# Patient Record
Sex: Female | Born: 1960 | Hispanic: No | Marital: Single | Smoking: Never smoker
Health system: Southern US, Community
[De-identification: ages and names within clinical notes are randomized; demographics above are authoritative.]

## PROBLEM LIST (undated history)

## (undated) DIAGNOSIS — R51 Headache: Secondary | ICD-10-CM

## (undated) DIAGNOSIS — K219 Gastro-esophageal reflux disease without esophagitis: Secondary | ICD-10-CM

## (undated) DIAGNOSIS — R87810 Cervical high risk human papillomavirus (HPV) DNA test positive: Secondary | ICD-10-CM

## (undated) DIAGNOSIS — R87619 Unspecified abnormal cytological findings in specimens from cervix uteri: Secondary | ICD-10-CM

## (undated) DIAGNOSIS — I10 Essential (primary) hypertension: Secondary | ICD-10-CM

## (undated) DIAGNOSIS — R519 Headache, unspecified: Secondary | ICD-10-CM

## (undated) HISTORY — DX: Unspecified abnormal cytological findings in specimens from cervix uteri: R87.619

## (undated) HISTORY — DX: Cervical high risk human papillomavirus (HPV) DNA test positive: R87.810

## (undated) HISTORY — PX: IRRIGATION AND DEBRIDEMENT SEBACEOUS CYST: SHX5255

## (undated) HISTORY — DX: Gastro-esophageal reflux disease without esophagitis: K21.9

## (undated) HISTORY — DX: Headache: R51

## (undated) HISTORY — DX: Essential (primary) hypertension: I10

## (undated) HISTORY — DX: Headache, unspecified: R51.9

---

## 2010-11-22 DIAGNOSIS — I10 Essential (primary) hypertension: Secondary | ICD-10-CM | POA: Insufficient documentation

## 2011-03-23 DIAGNOSIS — Z0000 Encounter for general adult medical examination without abnormal findings: Secondary | ICD-10-CM | POA: Insufficient documentation

## 2011-06-16 DIAGNOSIS — L723 Sebaceous cyst: Secondary | ICD-10-CM | POA: Insufficient documentation

## 2011-12-13 DIAGNOSIS — F32A Depression, unspecified: Secondary | ICD-10-CM | POA: Insufficient documentation

## 2011-12-13 DIAGNOSIS — B9681 Helicobacter pylori [H. pylori] as the cause of diseases classified elsewhere: Secondary | ICD-10-CM | POA: Insufficient documentation

## 2012-06-19 DIAGNOSIS — G44209 Tension-type headache, unspecified, not intractable: Secondary | ICD-10-CM | POA: Insufficient documentation

## 2012-06-19 DIAGNOSIS — G43909 Migraine, unspecified, not intractable, without status migrainosus: Secondary | ICD-10-CM | POA: Insufficient documentation

## 2012-12-04 DIAGNOSIS — M722 Plantar fascial fibromatosis: Secondary | ICD-10-CM | POA: Insufficient documentation

## 2013-03-05 DIAGNOSIS — M62838 Other muscle spasm: Secondary | ICD-10-CM | POA: Insufficient documentation

## 2013-08-19 DIAGNOSIS — J00 Acute nasopharyngitis [common cold]: Secondary | ICD-10-CM | POA: Insufficient documentation

## 2016-04-13 ENCOUNTER — Ambulatory Visit (INDEPENDENT_AMBULATORY_CARE_PROVIDER_SITE_OTHER): Payer: Managed Care, Other (non HMO) | Admitting: Physician Assistant

## 2016-04-13 ENCOUNTER — Encounter: Payer: Self-pay | Admitting: Physician Assistant

## 2016-04-13 VITALS — BP 112/76 | HR 71 | Ht 65.0 in | Wt 158.0 lb

## 2016-04-13 DIAGNOSIS — A084 Viral intestinal infection, unspecified: Secondary | ICD-10-CM | POA: Diagnosis not present

## 2016-04-13 DIAGNOSIS — H8110 Benign paroxysmal vertigo, unspecified ear: Secondary | ICD-10-CM | POA: Insufficient documentation

## 2016-04-13 MED ORDER — LOPERAMIDE HCL 2 MG PO TABS: ORAL_TABLET | ORAL | 0 refills | Status: DC

## 2016-04-13 NOTE — Progress Notes (Signed)
HPI:                                                                Frances Taylor is a 56 y.o. female who presents to [facility] [place] today to establish care   Current Concerns include diarrhea  Patient reports sudden onset nausea, vomiting and diarrhea over the weekend while at the beach. She states this was preceded by eating some seafood. Since that time nausea and vomiting have resolved, but she continues to have loose stools. She is tolerating PO and is actually eating her normal diet. Denies fever, chills. She states she became concerned today because she noticed blood on the tissue paper when she wiped after having a bowel movement. Denies hematochezia, melena, or tenesmus. She does endorse some "burning" abdominal pains. She has been taking Mylanta for this with some relief.  Patient states she was receiving primary care at [facility] and will have records transferred here. She states she had blood work last week. Pap: 2017 Mammogram: 08/2015 Colonoscopy: 08/2014  Health Maintenance Health Maintenance  Topic Date Due  . Hepatitis C Screening  12/19/1960  . HIV Screening  08/09/1976  . TETANUS/TDAP  08/09/1980  . INFLUENZA VACCINE  08/25/2015  . MAMMOGRAM  08/24/2017  . PAP SMEAR  08/10/2018  . COLONOSCOPY  08/24/2024    GYN/Sexual Health  Menstrual status: postmenopausal  LMP: age 21  Menses: n/a  Last pap smear: 2017 - negative  History of abnormal pap smears: no  Sexually active: not currently  Current contraception: none  Past Medical History:  Diagnosis Date  . Headache   . Hypertension    took herself off medication, lifestyle   Past Surgical History:  Procedure Laterality Date  . IRRIGATION AND DEBRIDEMENT SEBACEOUS CYST     scalp   Social History  Substance Use Topics  . Smoking status: Never Smoker  . Smokeless tobacco: Never Used  . Alcohol use No   family history is not on  file.  ROS: negative except as noted in the HPI  Medications: Current Outpatient Prescriptions  Medication Sig Dispense Refill  . Multiple Vitamins-Minerals (MULTIVITAMIN ADULTS 50+) TABS Take by mouth.    . loperamide (IMODIUM A-D) 2 MG tablet 4 mg ORALLY followed by 2 mg after each loose stool up to a maximum of 16 mg/day 30 tablet 0   No current facility-administered medications for this visit.    No Known Allergies     Objective:  BP 112/76   Pulse 71   Ht 5\' 5"  (1.651 m)   Wt 158 lb (71.7 kg)   BMI 26.29 kg/m  Gen: well-groomed, cooperative, not ill-appearing, no distress HEENT: normal conjunctiva, wearing glasses, oropharynx clear, moist mucus membranes,  Pulm: Normal work of breathing, normal phonation, clear to auscultation bilaterally CV: Normal rate, regular rhythm, s1 and s2 distinct, no murmurs, clicks or rubs, no carotid bruit GI: bowel sounds active, abdomen soft, nondistended, LLQ tenderness, no rebound, no guarding, no masses, normal rectum, Guaiac negative stool Neuro: alert and oriented x 3, EOM's intact, no tremor MSK: moving all extremities, normal gait and station, no peripheral edema Skin: warm and dry, no rashes or lesions on exposed skin Psych: normal affect, euthymic mood, normal speech  and thought content  Depression screen [facility] 2/9 04/14/2016  Decreased Interest 0  Down, Depressed, Hopeless 0  PHQ - 2 Score 0    Assessment and Plan: 56 y.o. female with  Viral gastroenteritis - recommend conservative treatment for the next 72 hours. Mild LLQ tenderness on exam today, but no peritoneal signs. Tolerating PO. Guaiac negative stool - OTC Culturelle probiotics - Bowel rest for the next 24 hours: only clear liquids. Then introduce a bland diet (see below) - Imodium as needed for diarrhea: 4 mg ORALLY followed by 2 mg after each loose stool up to a maximum of 16 mg/day  - If no improvement with conservative measures in 72 hours, will obtain CT  Abd/Pelvis  Patient education and anticipatory guidance given Patient agrees with treatment plan Follow-up in 3 days or sooner as needed  Darlyne Russian PA-C

## 2016-04-13 NOTE — Patient Instructions (Addendum)
- Bowel rest for the next 24 hours: only clear liquids. Then introduce a bland diet (see below) - Imodium as needed for diarrhea: 4 mg ORALLY followed by 2 mg after each loose stool up to a maximum of 16 mg/day  - Follow-up if you are still having diarrhea in 3 days  Clear Liquid Diet, Adult A clear liquid diet is a diet that includes only liquids that you can see through. You may need to follow a clear liquid diet if:  You develop a medical condition right before or after you have surgery.  You were not able to eat food for a long period of time.  You had a condition that gave you diarrhea.  You are going to have an exam, such as a colonoscopy, in which instruments will be put into your body to look at parts of your digestive system.  You are going to have bowel surgery. The usual goals of this diet are:  To rest the stomach and digestive system as much as possible.  To keep you hydrated.  To make sure you get some calories for energy.  To help you return to normal digestion. Most people need to follow this diet for only a short period of time. What do I need to know about this diet?  A clear liquid is a liquid that you can see through when you hold it up to a light.  A clear liquid diet does not provide all the nutrients that you need. It is important to choose a variety of the liquids that are allowed on this diet. That way, you will get as many nutrients as possible.  If you are not sure whether you can have certain items, ask your health care provider. What can I have?  Water and flavored water.  Fruit juices that do not have pulp, such as cranberry juice and apple juice.  Tea and coffee without milk or cream.  Clear bouillon or broth.  Broth-based soups that have been strained.  Flavored gelatins.  Honey.  Sugar water.  Frozen ice or frozen ice pops that do not contain milk, yogurt, fruit pieces, or fruit pulp.  Clear sodas.  Clear sports drinks. The  items listed above may not be a complete list of recommended liquids. Contact your dietitian for more options.  What can I not have?  Juices that have pulp.  Milk.  Cream or cream-based soups.  Yogurt. The items listed above may not be a complete list of liquids to avoid. Contact your dietitian for more information.  Summary  A clear liquid diet is a diet that includes only liquids that you can see through.  The goal of this diet is to help you recover by resting your digestive system, keeping you hydrated, and providing nutrients.  Make sure to avoid liquids with milk, cream, or pulp while on this diet. This information is not intended to replace advice given to you by your health care provider. Make sure you discuss any questions you have with your health care provider. Document Released: 01/10/2005 Document Revised: 08/25/2015 Document Reviewed: 12/07/2012 Elsevier Interactive Patient Education  2017 [place] Diet A bland diet consists of foods that do not have a lot of fat or fiber. Foods without fat or fiber are easier for the body to digest. They are also less likely to irritate your mouth, throat, stomach, and other parts of your gastrointestinal tract. A bland diet is sometimes called a BRAT diet. What is  my plan? Your health care provider or dietitian may recommend specific changes to your diet to prevent and treat your symptoms, such as:  Eating small meals often.  Cooking food until it is soft enough to chew easily.  Chewing your food well.  Drinking fluids slowly.  Not eating foods that are very spicy, sour, or fatty.  Not eating citrus fruits, such as oranges and grapefruit. What do I need to know about this diet?  Eat a variety of foods from the bland diet food list.  Do not follow a bland diet longer than you have to.  Ask your health care provider whether you should take vitamins. What foods can I eat? Grains   Hot cereals, such as cream  of wheat. Bread, crackers, or tortillas made from refined white flour. Rice. Vegetables  Canned or cooked vegetables. Mashed or boiled potatoes. Fruits  Bananas. Applesauce. Other types of cooked or canned fruit with the skin and seeds removed, such as canned peaches or pears. Meats and Other Protein Sources  Scrambled eggs. Creamy peanut butter or other nut butters. Lean, well-cooked meats, such as chicken or fish. Tofu. Soups or broths. Dairy  Low-fat dairy products, such as milk, cottage cheese, or yogurt. Beverages  Water. Herbal tea. Apple juice. Sweets and Desserts  Pudding. Custard. Fruit gelatin. Ice cream. Fats and Oils  Mild salad dressings. Canola or olive oil. The items listed above may not be a complete list of allowed foods or beverages. Contact your dietitian for more options.  What foods are not recommended? Foods and ingredients that are often not recommended include:  Spicy foods, such as hot sauce or salsa.  Fried foods.  Sour foods, such as pickled or fermented foods.  Raw vegetables or fruits, especially citrus or berries.  Caffeinated drinks.  Alcohol.  Strongly flavored seasonings or condiments. The items listed above may not be a complete list of foods and beverages that are not allowed. Contact your dietitian for more information.  This information is not intended to replace advice given to you by your health care provider. Make sure you discuss any questions you have with your health care provider. Document Released: 05/04/2015 Document Revised: 06/18/2015 Document Reviewed: 01/22/2014 Elsevier Interactive Patient Education  2017 [employer].

## 2016-04-14 ENCOUNTER — Encounter: Payer: Self-pay | Admitting: Physician Assistant

## 2016-04-25 ENCOUNTER — Ambulatory Visit (INDEPENDENT_AMBULATORY_CARE_PROVIDER_SITE_OTHER): Payer: Managed Care, Other (non HMO) | Admitting: Physician Assistant

## 2016-04-25 VITALS — BP 126/85 | HR 76 | Wt 155.0 lb

## 2016-04-25 DIAGNOSIS — E559 Vitamin D deficiency, unspecified: Secondary | ICD-10-CM | POA: Diagnosis not present

## 2016-04-25 DIAGNOSIS — N952 Postmenopausal atrophic vaginitis: Secondary | ICD-10-CM

## 2016-04-25 DIAGNOSIS — K219 Gastro-esophageal reflux disease without esophagitis: Secondary | ICD-10-CM | POA: Insufficient documentation

## 2016-04-25 MED ORDER — VITAMIN D (ERGOCALCIFEROL) 1.25 MG (50000 UNIT) PO CAPS: 50000.0000 [IU] | ORAL_CAPSULE | ORAL | 0 refills | Status: DC

## 2016-04-25 NOTE — Patient Instructions (Addendum)
[facility] [place] - multivitamin Start Vitamin D - once a week for 8 weeks, then OTC vitamin D3 1000-2000 units daily  Ranitidine (Zantac) or Gaviscon as needed for acid reflux  Food Choices for Gastroesophageal Reflux Disease, Adult When you have gastroesophageal reflux disease (GERD), the foods you eat and your eating habits are very important. Choosing the right foods can help ease the discomfort of GERD. Consider working with a diet and nutrition specialist (dietitian) to help you make healthy food choices. What general guidelines should I follow? Eating plan   Choose healthy foods low in fat, such as fruits, vegetables, whole grains, low-fat dairy products, and lean meat, fish, and poultry.  Eat frequent, small meals instead of three large meals each day. Eat your meals slowly, in a relaxed setting. Avoid bending over or lying down until 2-3 hours after eating.  Limit high-fat foods such as fatty meats or fried foods.  Limit your intake of oils, butter, and shortening to less than 8 teaspoons each day.  Avoid the following:  Foods that cause symptoms. These may be different for different people. Keep a food diary to keep track of foods that cause symptoms.  Alcohol.  Drinking large amounts of liquid with meals.  Eating meals during the 2-3 hours before bed.  Cook foods using methods other than frying. This may include baking, grilling, or broiling. Lifestyle    Maintain a healthy weight. Ask your health care provider what weight is healthy for you. If you need to lose weight, work with your health care provider to do so safely.  Exercise for at least 30 minutes on 5 or more days each week, or as told by your health care provider.  Avoid wearing clothes that fit tightly around your waist and chest.  Do not use any products that contain nicotine or tobacco, such as cigarettes and e-cigarettes. If you need help quitting, ask your health care provider.  Sleep with the head of  your bed raised. Use a wedge under the mattress or blocks under the bed frame to raise the head of the bed. What foods are not recommended? The items listed may not be a complete list. Talk with your dietitian about what dietary choices are best for you. Grains  Pastries or quick breads with added fat. [place] toast. Vegetables  Deep fried vegetables. [place] fries. Any vegetables prepared with added fat. Any vegetables that cause symptoms. For some people this may include tomatoes and tomato products, chili peppers, onions and garlic, and horseradish. Fruits  Any fruits prepared with added fat. Any fruits that cause symptoms. For some people this may include citrus fruits, such as oranges, grapefruit, pineapple, and lemons. Meats and other protein foods  High-fat meats, such as fatty beef or pork, hot dogs, ribs, ham, sausage, salami and bacon. Fried meat or protein, including fried fish and fried chicken. Nuts and nut butters. Dairy  Whole milk and chocolate milk. Sour cream. Cream. Ice cream. Cream cheese. Milk shakes. Beverages  Coffee and tea, with or without caffeine. Carbonated beverages. Sodas. Energy drinks. Fruit juice made with acidic fruits (such as orange or grapefruit). Tomato juice. Alcoholic drinks. Fats and oils  Butter. Margarine. Shortening. Ghee. Sweets and desserts  Chocolate and cocoa. Donuts. Seasoning and other foods  Pepper. Peppermint and spearmint. Any condiments, herbs, or seasonings that cause symptoms. For some people, this may include curry, hot sauce, or vinegar-based salad dressings. Summary  When you have gastroesophageal reflux disease (GERD), food and lifestyle choices are  very important to help ease the discomfort of GERD.  Eat frequent, small meals instead of three large meals each day. Eat your meals slowly, in a relaxed setting. Avoid bending over or lying down until 2-3 hours after eating.  Limit high-fat foods such as fatty meat or fried foods. This  information is not intended to replace advice given to you by your health care provider. Make sure you discuss any questions you have with your health care provider. Document Released: 01/10/2005 Document Revised: 01/12/2016 Document Reviewed: 01/12/2016 Elsevier Interactive Patient Education  2017 Elsevier Inc.  Conjugated Estrogens vaginal cream What is this medicine? CONJUGATED ESTROGENS (CON ju gate ed ESS troe jenz) are a mixture of female hormones. This cream can help relieve symptoms associated with menopause.like vaginal dryness and irritation. This medicine may be used for other purposes; ask your health care provider or pharmacist if you have questions. COMMON BRAND NAME(S): Premarin What should I tell my health care provider before I take this medicine? They need to know if you have any of these conditions: -abnormal vaginal bleeding -blood vessel disease or blood clots -breast, cervical, endometrial, or uterine cancer -dementia -diabetes -gallbladder disease -heart disease or recent heart attack -high blood pressure -high cholesterol -high level of calcium in the blood -hysterectomy -kidney disease -liver disease -migraine headaches -protein C deficiency -protein S deficiency -stroke -systemic lupus erythematosus (SLE) -tobacco smoker -an unusual or allergic reaction to estrogens other medicines, foods, dyes, or preservatives -pregnant or trying to get pregnant -breast-feeding How should I use this medicine? This medicine is for use in the vagina only. Do not take by mouth. Follow the directions on the prescription label. Use at bedtime unless otherwise directed by your doctor or health care professional. Use the special applicator supplied with the cream. Wash hands before and after use. Fill the applicator with the cream and remove from the tube. Lie on your back, part and bend your knees. Insert the applicator into the vagina and push the plunger to expel the cream  into the vagina. Wash the applicator with warm soapy water and rinse well. Use exactly as directed for the complete length of time prescribed. Do not stop using except on the advice of your doctor or health care professional. Talk to your pediatrician regarding the use of this medicine in children. Special care may be needed. A patient package insert for the product will be given with each prescription and refill. Read this sheet carefully each time. The sheet may change frequently. Overdosage: If you think you have taken too much of this medicine contact a poison control center or emergency room at once. NOTE: This medicine is only for you. Do not share this medicine with others. What if I miss a dose? If you miss a dose, use it as soon as you can. If it is almost time for your next dose, use only that dose. Do not use double or extra doses. What may interact with this medicine? Do not take this medicine with any of the following medications: -aromatase inhibitors like aminoglutethimide, anastrozole, exemestane, letrozole, testolactone This medicine may also interact with the following medications: -barbiturates used for inducing sleep or treating seizures -carbamazepine -grapefruit juice -medicines for fungal infections like itraconazole and ketoconazole -raloxifene or tamoxifen -rifabutin -rifampin -rifapentine -ritonavir -some antibiotics used to treat infections -St. John's Wort -warfarin This list may not describe all possible interactions. Give your health care provider a list of all the medicines, herbs, non-prescription drugs, or dietary supplements you  use. Also tell them if you smoke, drink alcohol, or use illegal drugs. Some items may interact with your medicine. What should I watch for while using this medicine? Visit your health care professional for regular checks on your progress. You will need a regular breast and pelvic exam. You should also discuss the need for regular  mammograms with your health care professional, and follow his or her guidelines. This medicine can make your body retain fluid, making your fingers, hands, or ankles swell. Your blood pressure can go up. Contact your doctor or health care professional if you feel you are retaining fluid. If you have any reason to think you are pregnant; stop taking this medicine at once and contact your doctor or health care professional. Tobacco smoking increases the risk of getting a blood clot or having a stroke, especially if you are more than 55 years old. You are strongly advised not to smoke. If you wear contact lenses and notice visual changes, or if the lenses begin to feel uncomfortable, consult your eye care specialist. If you are going to have elective surgery, you may need to stop taking this medicine beforehand. Consult your health care professional for advice prior to scheduling the surgery. What side effects may I notice from receiving this medicine? Side effects that you should report to your doctor or health care professional as soon as possible: -allergic reactions like skin rash, itching or hives, swelling of the face, lips, or tongue -breast tissue changes or discharge -changes in vision -chest pain -confusion, trouble speaking or understanding -dark urine -general ill feeling or flu-like symptoms -light-colored stools -nausea, vomiting -pain, swelling, warmth in the leg -right upper belly pain -severe headaches -shortness of breath -sudden numbness or weakness of the face, arm or leg -trouble walking, dizziness, loss of balance or coordination -unusual vaginal bleeding -yellowing of the eyes or skin Side effects that usually do not require medical attention (report to your doctor or health care professional if they continue or are bothersome): -hair loss -increased hunger or thirst -increased urination -symptoms of vaginal infection like itching, irritation or unusual  discharge -unusually weak or tired This list may not describe all possible side effects. Call your doctor for medical advice about side effects. You may report side effects to FDA at 1-800-FDA-1088. Where should I keep my medicine? Keep out of the reach of children. Store at room temperature between 15 and 30 degrees C (59 and 86 degrees F). Throw away any unused medicine after the expiration date. NOTE: This sheet is a summary. It may not cover all possible information. If you have questions about this medicine, talk to your doctor, pharmacist, or health care provider.  2018 Elsevier/Gold Standard (2010-04-14 09:20:36)

## 2016-04-25 NOTE — Progress Notes (Signed)
HPI:                                                                Frances Taylor is a 56 y.o. female who presents to [facility]: [place] today for follow-up diarrhea  Patient reports diarrhea has resolved. She has not had any BRBPR. She denies fever, chills, abdominal pain, melena, hematochezia, or tenesmus. She is continuing to take Culturelle probiotics. She does endorse occasional dyspepsia after meals and thinks this may be triggered by certain foods. She has been taking Mylanta for this as needed. She had a negative colonoscopy in 2016.  Patient also reports history of early onset menopause. She is not currently sexually active, but has a history of dyspareunia and vaginal dryness.   Past Medical History:  Diagnosis Date  . Headache   . Hypertension    took herself off medication, lifestyle   Past Surgical History:  Procedure Laterality Date  . IRRIGATION AND DEBRIDEMENT SEBACEOUS CYST     scalp   Social History  Substance Use Topics  . Smoking status: Never Smoker  . Smokeless tobacco: Never Used  . Alcohol use No   family history is not on file.  ROS: negative except as noted in the HPI  Medications: Current Outpatient Prescriptions  Medication Sig Dispense Refill  . loperamide (IMODIUM A-D) 2 MG tablet 4 mg ORALLY followed by 2 mg after each loose stool up to a maximum of 16 mg/day 30 tablet 0  . Multiple Vitamins-Minerals (MULTIVITAMIN ADULTS 50+) TABS Take by mouth.     No current facility-administered medications for this visit.    No Known Allergies     Objective:  BP 126/85   Pulse 76   Wt 155 lb (70.3 kg)   BMI 25.79 kg/m  Gen: well-groomed, cooperative, not ill-appearing, no distress Pulm: Normal work of breathing, normal phonation Neuro: alert and oriented x 3, EOM's intact MSK: moving all extremities, no peripheral edema, normal gait and station Psych: good eye contact, euthymic mood, normal speech  and thought content  Assessment and Plan: 56 y.o. female with   1. Gastroesophageal reflux disease, esophagitis presence not specified - OTC Ranitidine or Gaviscon as needed  2. Vitamin D deficiency - Vitamin D, Ergocalciferol, (DRISDOL) 50000 units CAPS capsule; Take 1 capsule (50,000 Units total) by mouth every 7 (seven) days. Take for 8 total doses(weeks)  Dispense: 8 capsule; Refill: 0  3. Postmenopausal atrophic vaginitis - discussed treatment options including vaginal estrogen and Intrarosa. 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 In following guidelines, patient should have repeat co-testing this year  Patient education and anticipatory guidance given Patient agrees with treatment plan Follow-up in 4 weeks for Pap smear or sooner as needed   Darlyne Russian PA-C

## 2016-04-27 ENCOUNTER — Encounter: Payer: Self-pay | Admitting: Physician Assistant

## 2016-04-27 DIAGNOSIS — N952 Postmenopausal atrophic vaginitis: Secondary | ICD-10-CM | POA: Insufficient documentation

## 2016-04-28 ENCOUNTER — Telehealth: Payer: Self-pay

## 2016-04-28 NOTE — Telephone Encounter (Signed)
Left vm for pt to return call to clinic in regards to recommendations listed below. -EH/RMA

## 2016-04-28 NOTE — Telephone Encounter (Signed)
-----   Message from [facility], [place] sent at 04/27/2016  9:47 PM EDT ----- Can you let patient know I was reviewing her records. 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

## 2016-05-12 ENCOUNTER — Encounter: Payer: Self-pay | Admitting: Physician Assistant

## 2016-05-12 ENCOUNTER — Ambulatory Visit (INDEPENDENT_AMBULATORY_CARE_PROVIDER_SITE_OTHER): Payer: Managed Care, Other (non HMO) | Admitting: Physician Assistant

## 2016-05-12 VITALS — BP 132/84 | HR 65 | Temp 98.2°F | Wt 156.0 lb

## 2016-05-12 DIAGNOSIS — T733XXA Exhaustion due to excessive exertion, initial encounter: Secondary | ICD-10-CM

## 2016-05-12 NOTE — Progress Notes (Signed)
HPI:                                                                Frances Taylor is a 56 y.o. female who presents to [place]: [place] today for fatigue  Patient reports generalized fatigue for approximately 1 week. She states she has never felt like this in her entire life. She denies any recent illness. She denies any associated symptoms including fever, chills, night sweats, myalgias, arthralgias, nausea, abdominal pain, change in bowel or bladder habits, easy bruising/bleeding, lymphadenopathy, or rash. She does report she is working 2 jobs, totaling 70 hours per week. She is going to sleep around 1 am and waking up around 5 am and does not feel rested. She began doing this approx 2 weeks ago.  Past Medical History:  Diagnosis Date  . 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 GERD (gastroesophageal reflux disease)   . Headache   . Hypertension    took herself off medication, lifestyle   Past Surgical History:  Procedure Laterality Date  . IRRIGATION AND DEBRIDEMENT SEBACEOUS CYST     scalp   Social History  Substance Use Topics  . Smoking status: Never Smoker  . Smokeless tobacco: Never Used  . Alcohol use No   family history is not on file.  ROS: negative except as noted in the HPI  Medications: Current Outpatient Prescriptions  Medication Sig Dispense Refill  . Lactobacillus-Inulin ([place] PO) Take by mouth.    . Multiple Vitamins-Minerals (MULTIVITAMIN ADULTS 50+) TABS Take by mouth.    . Vitamin D, Ergocalciferol, (DRISDOL) 50000 units CAPS capsule Take 1 capsule (50,000 Units total) by mouth every 7 (seven) days. Take for 8 total doses(weeks) 8 capsule 0   No current facility-administered medications for this visit.    No Known Allergies     Objective:  BP 132/84   Pulse 65   Temp 98.2 F (36.8 C)   Wt 156 lb (70.8 kg)   BMI 25.96 kg/m  Gen: well-groomed,  cooperative, appears tired, not ill-appearing, no distress HEENT: normal conjunctiva, TM's clear, oropharynx clear, moist mucus membranes,  Pulm: Normal work of breathing, normal phonation, clear to auscultation bilaterally, no wheezes, rales or rhonchi CV: Normal rate, regular rhythm, s1 and s2 distinct, no murmurs, clicks or rubs  GI: soft, nondistended, nontender Neuro: alert and oriented x 3, EOM's intact, normal tone, no tremor MSK: moving all extremities, normal gait and station, no peripheral edema Lymph: no cervical or tonsillar adenopathy Skin: warm and dry, no rashes or lesions on exposed skin, no cyanosis   No results found for this or any previous visit (from the past 72 hour(s)). No results found.    Assessment and Plan: 56 y.o. female with   1. Fatigue due to excessive exertion - checking labs, but given no associated symptoms and normal exam,  I think fatigue can be explained by exhaustion from the hours patient is working and lack of sleep - provided with work note to rest - encouraged her to limit work to no more than 50 hours per week - encouraged 6-7 hours of sleep per night - CBC - Comprehensive metabolic panel - C-reactive protein - Ferritin - Sedimentation  rate - TSH - Vitamin B12 - Vit D  25 hydroxy (rtn osteoporosis monitoring)  Patient education and anticipatory guidance given Patient agrees with treatment plan Follow-up in 2 weeks or sooner as needed if symptoms worsen or fail to improve  Darlyne Russian PA-C

## 2016-05-12 NOTE — Patient Instructions (Signed)
- Go downstairs for labs - If possible, limit work to 50 hours per week - Try to get at least 6 - 7 hours of sleep per night - If you are having difficulty falling asleep, try Melatonin 1mg  1 hour prior to bedtime (do not take more than 3mg ) - Practice good sleep hygiene  Sleep Hygiene . Limiting daytime naps to 30 minutes . Napping does not make up for inadequate nighttime sleep. However, a short nap of 20-30 minutes can help to improve mood, alertness and performance.  . Avoiding stimulants such as  caffeine and nicotine close to bedtime.  And when it comes to alcohol, moderation is key 4. While alcohol is well-known to help you fall asleep faster, too much close to bedtime can disrupt sleep in the second half of the night as the body begins to process the alcohol.    . Exercising to promote good quality sleep.  As little as 10 minutes of aerobic exercise, such as walking or cycling, can drastically improve nighttime sleep quality.  For the best night's sleep, most people should avoid strenuous workouts close to bedtime. However, the effect of intense nighttime exercise on sleep differs from person to person, so find out what works best for you.   . Steering clear of food that can be disruptive right before sleep.   Heavy or rich foods, fatty or fried meals, spicy dishes, citrus fruits, and carbonated drinks can trigger indigestion for some people. When this occurs close to bedtime, it can lead to painful heartburn that disrupts sleep. . Ensuring adequate exposure to natural light.  This is particularly important for individuals who may not venture outside frequently. Exposure to sunlight during the day, as well as darkness at night, helps to maintain a healthy sleep-wake cycle . Marland Kitchen Establishing a regular relaxing bedtime routine.  A regular nightly routine helps the body recognize that it is bedtime. This could include taking warm shower or bath, reading a book, or light stretches. When possible, try  to avoid emotionally upsetting conversations and activities before attempting to sleep. . Making sure that the sleep environment is pleasant.  Mattress and pillows should be comfortable. The bedroom should be cool - between 60 and 67 degrees - for optimal sleep. Bright light from lamps, cell phone and TV screens can make it difficult to fall asleep4, so turn those light off or adjust them when possible. Consider using blackout curtains, eye shades, ear plugs, "white noise" machines, humidifiers, fans and other devices that can make the bedroom more relaxing.  Fatigue Fatigue is feeling tired all of the time, a lack of energy, or a lack of motivation. Occasional or mild fatigue is often a normal response to activity or life in general. However, long-lasting (chronic) or extreme fatigue may indicate an underlying medical condition. Follow these instructions at home: Watch your fatigue for any changes. The following actions may help to lessen any discomfort you are feeling:  Talk to your health care provider about how much sleep you need each night. Try to get the required amount every night.  Take medicines only as directed by your health care provider.  Eat a healthy and nutritious diet. Ask your health care provider if you need help changing your diet.  Drink enough fluid to keep your urine clear or pale yellow.  Practice ways of relaxing, such as yoga, meditation, massage therapy, or acupuncture.  Exercise regularly.  Change situations that cause you stress. Try to keep your work and personal  routine reasonable.  Do not abuse illegal drugs.  Limit alcohol intake to no more than 1 drink per day for nonpregnant women and 2 drinks per day for men. One drink equals 12 ounces of beer, 5 ounces of wine, or 1 ounces of hard liquor.  Take a multivitamin, if directed by your health care provider. Contact a health care provider if:  Your fatigue does not get better.  You have a fever.  You  have unintentional weight loss or gain.  You have headaches.  You have difficulty:  Falling asleep.  Sleeping throughout the night.  You feel angry, guilty, anxious, or sad.  You are unable to have a bowel movement (constipation).  You skin is dry.  Your legs or another part of your body is swollen. Get help right away if:  You feel confused.  Your vision is blurry.  You feel faint or pass out.  You have a severe headache.  You have severe abdominal, pelvic, or back pain.  You have chest pain, shortness of breath, or an irregular or fast heartbeat.  You are unable to urinate or you urinate less than normal.  You develop abnormal bleeding, such as bleeding from the rectum, vagina, nose, lungs, or nipples.  You vomit blood.  You have thoughts about harming yourself or committing suicide.  You are worried that you might harm someone else. This information is not intended to replace advice given to you by your health care provider. Make sure you discuss any questions you have with your health care provider. Document Released: 11/07/2006 Document Revised: 06/18/2015 Document Reviewed: 05/14/2013 Elsevier Interactive Patient Education  2017 [employer].

## 2016-05-13 LAB — COMPREHENSIVE METABOLIC PANEL
ALK PHOS: 81 U/L (ref 33–130)
ALT: 23 U/L (ref 6–29)
AST: 30 U/L (ref 10–35)
Albumin: 4 g/dL (ref 3.6–5.1)
BILIRUBIN TOTAL: 0.4 mg/dL (ref 0.2–1.2)
BUN: 9 mg/dL (ref 7–25)
CO2: 26 mmol/L (ref 20–31)
Calcium: 9.6 mg/dL (ref 8.6–10.4)
Chloride: 108 mmol/L (ref 98–110)
Creat: 0.92 mg/dL (ref 0.50–1.05)
GLUCOSE: 84 mg/dL (ref 65–99)
Potassium: 3.9 mmol/L (ref 3.5–5.3)
SODIUM: 143 mmol/L (ref 135–146)
Total Protein: 6.6 g/dL (ref 6.1–8.1)

## 2016-05-13 LAB — FERRITIN: Ferritin: 63 ng/mL (ref 10–232)

## 2016-05-13 LAB — TSH: TSH: 1.98 mIU/L

## 2016-05-13 LAB — CBC
HCT: 39 % (ref 35.0–45.0)
Hemoglobin: 12.5 g/dL (ref 11.7–15.5)
MCH: 25.5 pg — ABNORMAL LOW (ref 27.0–33.0)
MCHC: 32.1 g/dL (ref 32.0–36.0)
MCV: 79.6 fL — ABNORMAL LOW (ref 80.0–100.0)
MPV: 8.9 fL (ref 7.5–12.5)
PLATELETS: 254 10*3/uL (ref 140–400)
RBC: 4.9 MIL/uL (ref 3.80–5.10)
RDW: 13.4 % (ref 11.0–15.0)
WBC: 5.5 10*3/uL (ref 3.8–10.8)

## 2016-05-13 LAB — VITAMIN B12: VITAMIN B 12: 352 pg/mL (ref 200–1100)

## 2016-05-13 LAB — VITAMIN D 25 HYDROXY (VIT D DEFICIENCY, FRACTURES): Vit D, 25-Hydroxy: 54 ng/mL (ref 30–100)

## 2016-05-13 LAB — SEDIMENTATION RATE: Sed Rate: 12 mm/hr (ref 0–30)

## 2016-05-13 LAB — C-REACTIVE PROTEIN: CRP: 0.9 mg/L (ref <8.0)

## 2016-05-19 ENCOUNTER — Telehealth: Payer: Self-pay

## 2016-05-19 NOTE — Telephone Encounter (Signed)
Pt called wondering if her going through menopause could be the cause of her fatigue. Please advise. -EH/RMA

## 2016-05-23 NOTE — Telephone Encounter (Signed)
If menopausal symptoms have recently started it could be contributing. For example, nightsweats can cause sleep disturbance. If she is having hot flashes and nightsweats and would like to discuss hormone replacement, she should definitely make an appointment

## 2016-05-24 NOTE — Telephone Encounter (Signed)
Left vm for pt to return call to clinic -EH/RMA  

## 2016-05-26 ENCOUNTER — Ambulatory Visit: Payer: Managed Care, Other (non HMO) | Admitting: Physician Assistant

## 2016-06-13 ENCOUNTER — Ambulatory Visit (INDEPENDENT_AMBULATORY_CARE_PROVIDER_SITE_OTHER): Payer: Managed Care, Other (non HMO) | Admitting: Physician Assistant

## 2016-06-13 ENCOUNTER — Encounter: Payer: Self-pay | Admitting: Physician Assistant

## 2016-06-13 VITALS — BP 131/86 | HR 75 | Temp 98.2°F | Wt 159.0 lb

## 2016-06-13 DIAGNOSIS — R232 Flushing: Secondary | ICD-10-CM

## 2016-06-13 DIAGNOSIS — Z13820 Encounter for screening for osteoporosis: Secondary | ICD-10-CM

## 2016-06-13 DIAGNOSIS — J069 Acute upper respiratory infection, unspecified: Secondary | ICD-10-CM | POA: Diagnosis not present

## 2016-06-13 DIAGNOSIS — N3001 Acute cystitis with hematuria: Secondary | ICD-10-CM | POA: Diagnosis not present

## 2016-06-13 DIAGNOSIS — E28319 Asymptomatic premature menopause: Secondary | ICD-10-CM

## 2016-06-13 LAB — POCT URINALYSIS DIPSTICK
Bilirubin, UA: NEGATIVE
GLUCOSE UA: NEGATIVE
Ketones, UA: NEGATIVE
NITRITE UA: NEGATIVE
PROTEIN UA: NEGATIVE
Spec Grav, UA: <=1.005 — AB (ref 1.010–1.025)
UROBILINOGEN UA: 0.2 U/dL
pH, UA: 6 (ref 5.0–8.0)

## 2016-06-13 MED ORDER — CALCIUM CARBONATE-VITAMIN D 600-400 MG-UNIT PO TABS: 1.0000 | ORAL_TABLET | Freq: Two times a day (BID) | ORAL | 11 refills | Status: DC

## 2016-06-13 MED ORDER — BENZONATATE 200 MG PO CAPS: 200.0000 mg | ORAL_CAPSULE | Freq: Three times a day (TID) | ORAL | 0 refills | Status: DC | PRN

## 2016-06-13 MED ORDER — NITROFURANTOIN MONOHYD MACRO 100 MG PO CAPS: 100.0000 mg | ORAL_CAPSULE | Freq: Two times a day (BID) | ORAL | 0 refills | Status: DC

## 2016-06-13 NOTE — Patient Instructions (Addendum)
- You will get a phone call to schedule your bone density scan to screen for osteoporosis - Take calcium and vitamin D supplement twice a day   - Take antibiotic twice a day for 7 days - Continue to drink plenty of fluids   Bone Health Bones protect organs, store calcium, and anchor muscles. Good health habits, such as eating nutritious foods and exercising regularly, are important for maintaining healthy bones. They can also help to prevent a condition that causes bones to lose density and become weak and brittle (osteoporosis). Why is bone mass important? Bone mass refers to the amount of bone tissue that you have. The higher your bone mass, the stronger your bones. An important step toward having healthy bones throughout life is to have strong and dense bones during childhood. A young adult who has a high bone mass is more likely to have a high bone mass later in life. Bone mass at its greatest it is called peak bone mass. A large decline in bone mass occurs in older adults. In women, it occurs about the time of menopause. During this time, it is important to practice good health habits, because if more bone is lost than what is replaced, the bones will become less healthy and more likely to break (fracture). If you find that you have a low bone mass, you may be able to prevent osteoporosis or further bone loss by changing your diet and lifestyle. How can I find out if my bone mass is low? Bone mass can be measured with an X-ray test that is called a bone mineral density (BMD) test. This test is recommended for all women who are age 32 or older. It may also be recommended for men who are age 87 or older, or for people who are more likely to develop osteoporosis due to:  Having bones that break easily.  Having a long-term disease that weakens bones, such as kidney disease or rheumatoid arthritis.  Having menopause earlier than normal.  Taking medicine that weakens bones, such as steroids,  thyroid hormones, or hormone treatment for breast cancer or prostate cancer.  Smoking.  Drinking three or more alcoholic drinks each day. What are the nutritional recommendations for healthy bones? To have healthy bones, you need to get enough of the right minerals and vitamins. Most nutrition experts recommend getting these nutrients from the foods that you eat. Nutritional recommendations vary from person to person. Ask your health care provider what is healthy for you. Here are some general guidelines. Calcium Recommendations  Calcium is the most important (essential) mineral for bone health. Most people can get enough calcium from their diet, but supplements may be recommended for people who are at risk for osteoporosis. Good sources of calcium include:  Dairy products, such as low-fat or nonfat milk, cheese, and yogurt.  Dark green leafy vegetables, such as bok choy and broccoli.  Calcium-fortified foods, such as orange juice, cereal, bread, soy beverages, and tofu products.  Nuts, such as almonds. Follow these recommended amounts for daily calcium intake:  Children, age 35?3: 700 mg.  Children, age 76?8: 1,000 mg.  Children, age 357?13: 1,300 mg.  Teens, age 61?18: 1,300 mg.  Adults, age 58?50: 1,000 mg.  Adults, age 69?70:  Men: 1,000 mg.  Women: 1,200 mg.  Adults, age 37 or older: 1,200 mg.  Pregnant and breastfeeding females:  Teens: 1,300 mg.  Adults: 1,000 mg. Vitamin D Recommendations  Vitamin D is the most essential vitamin for bone health.  It helps the body to absorb calcium. Sunlight stimulates the skin to make vitamin D, so be sure to get enough sunlight. If you live in a cold climate or you do not get outside often, your health care provider may recommend that you take vitamin D supplements. Good sources of vitamin D in your diet include:  Egg yolks.  Saltwater fish.  Milk and cereal fortified with vitamin D. Follow these recommended amounts for daily  vitamin D intake:  Children and teens, age 24?18: 37 international units.  Adults, age 17 or younger: 400-800 international units.  Adults, age 81 or older: 800-1,000 international units. Other Nutrients  Other nutrients for bone health include:  Phosphorus. This mineral is found in meat, poultry, dairy foods, nuts, and legumes. The recommended daily intake for adult men and adult women is 700 mg.  Magnesium. This mineral is found in seeds, nuts, dark green vegetables, and legumes. The recommended daily intake for adult men is 400?420 mg. For adult women, it is 310?320 mg.  Vitamin K. This vitamin is found in green leafy vegetables. The recommended daily intake is 120 mg for adult men and 90 mg for adult women. What type of physical activity is best for building and maintaining healthy bones? Weight-bearing and strength-building activities are important for building and maintaining peak bone mass. Weight-bearing activities cause muscles and bones to work against gravity. Strength-building activities increases muscle strength that supports bones. Weight-bearing and muscle-building activities include:  Walking and hiking.  Jogging and running.  Dancing.  Gym exercises.  Lifting weights.  Tennis and racquetball.  Climbing stairs.  Aerobics. Adults should get at least 30 minutes of moderate physical activity on most days. Children should get at least 60 minutes of moderate physical activity on most days. Ask your health care provide what type of exercise is best for you. Where can I find more information? For more information, check out the following websites:  [place]: [employer]se  [employer] of Health: http://www.niams.AnonymousEar.fr.asp This information is not intended to replace advice given to you by your health care provider. Make sure you discuss any questions you have with your  health care provider. Document Released: 04/02/2003 Document Revised: 07/31/2015 Document Reviewed: 01/15/2014 Elsevier Interactive Patient Education  2017 [employer].

## 2016-06-13 NOTE — Progress Notes (Signed)
HPI:                                                                Frances Taylor is a 56 y.o. female who presents to [place]: [place] today for UTI and URI symptoms  URI   This is a new (Patient reports she was babysitting her granddaughter over the weekend, who had a cold) problem. The current episode started yesterday. The problem has been gradually improving. There has been no fever. Associated symptoms include coughing (non-productive), dysuria and rhinorrhea (clear). Pertinent negatives include no congestion, headaches, neck pain, rash or sinus pain. Treatments tried: Alka Seltzer Plus. The treatment provided mild relief.  Dysuria   This is a new problem. The current episode started today. The problem has been unchanged. The quality of the pain is described as burning. Pertinent negatives include no flank pain, frequency, hematuria, hesitancy or urgency. She has tried nothing for the symptoms. There is no history of recurrent UTIs.   Patient also continues to endorse daily and nightly hot flashes that is causing sleep disturbance and fatigue. Patient has a history of premature menopause at age 82. She has a history of vitamin D deficiency and is on supplementation. Most recent vitamin d level was therapeutic. She has never been on HRT. She has never had a Dexa scan. She has not history of pathologic fractures.   Past Medical History:  Diagnosis Date  . Cervical high risk HPV (human papillomavirus) test positive   . GERD (gastroesophageal reflux disease)   . Headache   . Hypertension    took herself off medication, lifestyle   Past Surgical History:  Procedure Laterality Date  . IRRIGATION AND DEBRIDEMENT SEBACEOUS CYST     scalp   Social History  Substance Use Topics  . Smoking status: Never Smoker  . Smokeless tobacco: Never Used  . Alcohol use No   family history is not on file.  ROS: negative except as noted in the  HPI  Medications: Current Outpatient Prescriptions  Medication Sig Dispense Refill  . Lactobacillus-Inulin ([place] PO) Take by mouth.    . Multiple Vitamins-Minerals (MULTIVITAMIN ADULTS 50+) TABS Take by mouth.    . Vitamin D, Ergocalciferol, (DRISDOL) 50000 units CAPS capsule Take 1 capsule (50,000 Units total) by mouth every 7 (seven) days. Take for 8 total doses(weeks) 8 capsule 0   No current facility-administered medications for this visit.    No Known Allergies     Objective:  BP 131/86   Pulse 75   Temp 98.2 F (36.8 C) (Oral)   Wt 159 lb (72.1 kg)   BMI 26.46 kg/m  Gen: well-groomed, cooperative, not ill-appearing, no distress HEENT: normal conjunctiva, TM's clear, nasal mucosa pink, oropharynx clear, moist mucus membranes, no frontal or maxillary sinus tenderness, neck supple, trachea midline Pulm: Normal work of breathing, normal phonation, clear to auscultation bilaterally, no wheezes, rales or rhonchi CV: Normal rate, regular rhythm, s1 and s2 distinct, no murmurs, clicks or rubs  Neuro: alert and oriented x 3, EOM's intact, no tremor MSK: moving all extremities, normal gait and station, no peripheral edema Lymph: no cervical or tonsillar adenopathy Skin: warm, dry, intact; no rashes or lesions on exposed skin, no cyanosis  Results for orders placed or performed in visit on 06/13/16 (from the past 72 hour(s))  Urinalysis Dipstick     Status: Abnormal   Collection Time: 06/13/16  4:05 PM  Result Value Ref Range   Color, UA yellow    Clarity, UA clear    Glucose, UA negative    Bilirubin, UA negative    Ketones, UA negative    Spec Grav, UA <=1.005 (A) 1.010 - 1.025   Blood, UA traced-lysed    pH, UA 6.0 5.0 - 8.0   Protein, UA negative    Urobilinogen, UA 0.2 0.2 or 1.0 E.U./dL   Nitrite, UA negative    Leukocytes, UA Small (1+) (A) Negative   No results found.    Assessment and Plan: 56 y.o. female with   1. Acute cystitis  with hematuria - Urinalysis Dipstick positve for trace blood and small leuks - Urine Culture pending - nitrofurantoin, macrocrystal-monohydrate, (MACROBID) 100 MG capsule; Take 1 capsule (100 mg total) by mouth 2 (two) times daily.  Dispense: 14 capsule; Refill: 0  2. Acute URI - symptomatic management  - benzonatate (TESSALON) 200 MG capsule; Take 1 capsule (200 mg total) by mouth 3 (three) times daily as needed for cough.  Dispense: 45 capsule; Refill: 0  3. Premature menopause, Osteoporosis Screening - DG Bone Density; Future - Calcium Carbonate-Vitamin D 600-400 MG-UNIT tablet; Take 1 tablet by mouth 2 (two) times daily.  Dispense: 60 tablet; Refill: 11 - encouraged regular weight bearing exercise  4. Vasomotor flushing - educated on nonpharmacologic measures - patient declines HRT  Patient education and anticipatory guidance given Patient agrees with treatment plan Follow-up as needed if symptoms worsen or fail to improve  Darlyne Russian PA-C

## 2016-06-17 LAB — URINE CULTURE

## 2016-08-02 ENCOUNTER — Encounter: Payer: Self-pay | Admitting: Physician Assistant

## 2016-08-02 ENCOUNTER — Ambulatory Visit (INDEPENDENT_AMBULATORY_CARE_PROVIDER_SITE_OTHER): Payer: Managed Care, Other (non HMO) | Admitting: Physician Assistant

## 2016-08-02 VITALS — BP 120/76 | HR 67 | Wt 155.0 lb

## 2016-08-02 DIAGNOSIS — M4310 Spondylolisthesis, site unspecified: Secondary | ICD-10-CM | POA: Insufficient documentation

## 2016-08-02 DIAGNOSIS — M4696 Unspecified inflammatory spondylopathy, lumbar region: Secondary | ICD-10-CM

## 2016-08-02 DIAGNOSIS — Z09 Encounter for follow-up examination after completed treatment for conditions other than malignant neoplasm: Secondary | ICD-10-CM

## 2016-08-02 DIAGNOSIS — M47816 Spondylosis without myelopathy or radiculopathy, lumbar region: Secondary | ICD-10-CM | POA: Insufficient documentation

## 2016-08-02 DIAGNOSIS — M5441 Lumbago with sciatica, right side: Secondary | ICD-10-CM

## 2016-08-02 DIAGNOSIS — M5416 Radiculopathy, lumbar region: Secondary | ICD-10-CM | POA: Insufficient documentation

## 2016-08-02 MED ORDER — GABAPENTIN 100 MG PO CAPS: 100.0000 mg | ORAL_CAPSULE | Freq: Two times a day (BID) | ORAL | 11 refills | Status: DC

## 2016-08-02 MED ORDER — MELOXICAM 15 MG PO TABS: 15.0000 mg | ORAL_TABLET | Freq: Every day | ORAL | 2 refills | Status: DC

## 2016-08-02 MED ORDER — METHOCARBAMOL 500 MG PO TABS: 1000.0000 mg | ORAL_TABLET | Freq: Four times a day (QID) | ORAL | 0 refills | Status: AC

## 2016-08-02 NOTE — Patient Instructions (Addendum)
-   Finish your Prednisone as prescribed. When you finish the Prednisone, start Meloxicam 15mg  every morning - Continue Robaxin 1000mg  up to 4 times daily - Start Gabapentin 100mg  at bedtime. May increase to 100mg  three times per day - Start to wean off of hydrocodone pain medication - Ice or Heat x 20 minutes three times per day to right lower back (which ever feels better) - I have placed a referral to physical therapy. You will be contacted to schedule an appointment with them - Follow-up with Sports Medicine in 4 weeks or sooner if needed

## 2016-08-02 NOTE — Progress Notes (Signed)
HPI:                                                                Frances Taylor is a 56 y.o. female who presents to [facility] today for hospital follow-up  Patient presents today with acute on chronic right-sided LBP with radiculopathy. She was evaluated at [facility] ED on 07/27/16 after a trip and fall, landing on her right side. X-rays were negative for acute fractures. LSpine revealed lower lumbar facet arthritis and mild anterolisthesis of L5-S1.   She continues to endorse right-sided low back pain today with pain radiating down the entire right leg. Pain is moderate, persistent. Denies bowel or bladder dysfunction. Denies paresthesias. She has been taking Percocet, Robaxin, and prednisone with minimal relief.  Past Medical History:  Diagnosis Date  . Cervical high risk HPV (human papillomavirus) test positive   . GERD (gastroesophageal reflux disease)   . Headache   . Hypertension    took herself off medication, lifestyle   Past Surgical History:  Procedure Laterality Date  . IRRIGATION AND DEBRIDEMENT SEBACEOUS CYST     scalp   Social History  Substance Use Topics  . Smoking status: Never Smoker  . Smokeless tobacco: Never Used  . Alcohol use No   family history is not on file.  ROS: negative except as noted in the HPI  Medications: Current Outpatient Prescriptions  Medication Sig Dispense Refill  . HYDROcodone-acetaminophen (NORCO/VICODIN) 5-325 MG tablet Take by mouth.    . Lactobacillus-Inulin (Percival PO) Take by mouth.    . methocarbamol (ROBAXIN) 500 MG tablet Take by mouth.    . Multiple Vitamins-Minerals (MULTIVITAMIN ADULTS 50+) TABS Take by mouth.    . predniSONE (DELTASONE) 20 MG tablet Days 1,2, Take 3 pills once a day. Days 3,4,5 take 2 pills once a day. Days 6,7,8 take one pill a day.     No current facility-administered medications for this visit.    No Known  Allergies     Objective:  BP 120/76   Pulse 67   Wt 155 lb (70.3 kg)   BMI 25.79 kg/m  Gen: well-groomed, cooperative, not ill-appearing, no distress HEENT: normal conjunctiva, wearing glasses, trachea midline Pulm: Normal work of breathing, normal phonation Neuro: alert and oriented x 3, DTR's intact, no tremor MSK: Back - no spinous process tenderness or step off deformity, tenderness of the right lower lumbar area and right SI joint, positive straight leg raise on the right, 5/5 strength in bilateral lower extremities; extremities atraumatic, normal gait and station, no peripheral edema Skin: warm, dry, intact; no rashes or lesions on exposed skin  XR Spine Lumbar 2-3 Views7/04/2016 [facility] Result Impression  IMPRESSION: 1.No acute findings.  2.Lower lumbar facet degenerative changes with minimal anterolisthesis of L5 on S1  Electronically Signed by: Sharin Mons  Result Narrative  COMPARISON:None INDICATION: Lower Back Pain TECHNIQUE:XR SPINE LUMBAR 2-3 VIEWS - Exam date/time:07/27/2016 3:35 PM  FINDINGS:  #No acute fracture. Grade 1 anterolisthesis of L5 on S1. Disc spaces are maintained. Lower lumbar facet degenerative changes.  Other Result Information  Acute Interface, Incoming Rad Results - 07/27/2016  4:14 PM EDT COMPARISON:  None INDICATION: Lower Back Pain   TECHNIQUE:  XR SPINE  LUMBAR 2-3 VIEWS - Exam date/time:  07/27/2016 3:35 PM  FINDINGS:  #  No acute fracture. Grade 1 anterolisthesis of L5 on S1. Disc spaces are maintained. Lower lumbar facet degenerative changes.   IMPRESSION: 1.  No acute findings.  2.  Lower lumbar facet degenerative changes with minimal anterolisthesis of L5 on S1  Electronically Signed by: Sharin Mons    Assessment and Plan: 56 y.o. female with   Hospital discharge follow-up - personally reviewed HPI, A&P note and imaging reports from 07/27/16  1. Acute right-sided low back pain with right-sided  sciatica - personally reviewed X-ray reports from 07/27/16 - discussed that pain is due to her lumbar degenerative changes in the facet joints - discontinuing Percocet. Switching to gabapentin. - instructed to complete Prednisone, then switch to Meloxicam daily - increase Robaxin to scheduled QID - referring to physical therapy  - gabapentin (NEURONTIN) 100 MG capsule; Take 1 capsule (100 mg total) by mouth 2 (two) times daily.  Dispense: 60 capsule; Refill: 11 - Ambulatory referral to Physical Therapy - methocarbamol (ROBAXIN) 500 MG tablet; Take 2 tablets (1,000 mg total) by mouth 4 (four) times daily.  Dispense: 80 tablet; Refill: 0 - meloxicam (MOBIC) 15 MG tablet; Take 1 tablet (15 mg total) by mouth daily.  Dispense: 30 tablet; Refill: 2  2. Lumbar facet arthropathy ([place]) - Ambulatory referral to Physical Therapy  3. Anterolisthesis - Ambulatory referral to Physical Therapy  Patient education and anticipatory guidance given Patient agrees with treatment plan Follow-up with Sports Medicine in 4 weeks or sooner as needed if symptoms worsen or fail to improve  Darlyne Russian PA-C

## 2016-08-04 ENCOUNTER — Ambulatory Visit (INDEPENDENT_AMBULATORY_CARE_PROVIDER_SITE_OTHER): Payer: Managed Care, Other (non HMO) | Admitting: Rehabilitative and Restorative Service Providers"

## 2016-08-04 ENCOUNTER — Encounter: Payer: Self-pay | Admitting: Rehabilitative and Restorative Service Providers"

## 2016-08-04 DIAGNOSIS — R29898 Other symptoms and signs involving the musculoskeletal system: Secondary | ICD-10-CM

## 2016-08-04 DIAGNOSIS — M6281 Muscle weakness (generalized): Secondary | ICD-10-CM

## 2016-08-04 DIAGNOSIS — M545 Low back pain: Secondary | ICD-10-CM

## 2016-08-04 DIAGNOSIS — M79604 Pain in right leg: Secondary | ICD-10-CM | POA: Diagnosis not present

## 2016-08-04 NOTE — Therapy (Signed)
[place], [place], [address] Phone: [phone]   Fax:  [phone]  Physical Therapy Evaluation  Patient Details  Name: Frances Taylor MRN: [id number] Date of Birth: [birth year] Referring Provider: Nelson Chimes, PA-C   Encounter Date: 08/04/2016      PT End of Session - 08/04/16 1534    Visit Number 1   Number of Visits 12   Date for PT Re-Evaluation 09/15/16   PT Start Time 1448   PT Stop Time 1630   PT Time Calculation (min) 57 min   Activity Tolerance Patient tolerated treatment well      Past Medical History:  Diagnosis Date  . Cervical high risk HPV (human papillomavirus) test positive   . GERD (gastroesophageal reflux disease)   . Headache   . Hypertension    took herself off medication, lifestyle    Past Surgical History:  Procedure Laterality Date  . IRRIGATION AND DEBRIDEMENT SEBACEOUS CYST     scalp    There were no vitals filed for this visit.       Subjective Assessment - 08/04/16 1539    Subjective Frances Taylor reports that she has Rt sided LBP with pain in the Rt LE. She felt pain and a pop ~ 6 weeks ago. She has had continued pain with symtpoms radiating into the Rt LE. She has been treated with OTC antiinflammatory meds. She missed a step and fell down three steps ~ 2 weeks ago and has had increased pain in the back and leg since then.    Pertinent History denies any musculoskeletal problems or medical problems; does have some pain and "problems" in the Rt side of her neck at times; vertigo    How long can you sit comfortably? 10 min    How long can you stand comfortably? 2-3 min    How long can you walk comfortably? 5 min    Diagnostic tests none   Patient Stated Goals get rid of back and leg pain    Currently in Pain? Yes   Pain Score 5    Pain Location Back   Pain Orientation Right;Lower   Pain Descriptors / Indicators Sharp;Dull;Nagging;Aching;Burning    Pain Type Acute pain   Pain Radiating Towards into Rt LE into the quad sometimes to knee sometimes into the foot. Also has pain in Rt UE intermittently    Pain Onset More than a month ago   Pain Frequency Constant   Aggravating Factors  prolonged positions; lifting; bending; reaching; lying down to sleep   Pain Relieving Factors meds            [facility] PT Assessment - 08/04/16 0001      Assessment   Medical Diagnosis Rt sided LBP Rt sciatica   Referring Provider Nelson Chimes, PA-C    Onset Date/Surgical Date 06/24/16   Hand Dominance Right   Next MD Visit 08/30/16   Prior Therapy none for back - some for shoulder in [facility]      Precautions   Precautions None     Balance Screen   Has the patient fallen in the past 6 months Yes   How many times? 2  "blacked out" ~ 4 months ago; fell down steps ~ 2 weeks ago    Has the patient had a decrease in activity level because of a fear of falling?  No   Is the patient reluctant to leave their home because of a fear of falling?  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 to replace pallet every 2-3 hours - very active job requiring repetitive lifting throughout the day    Leisure household chores;      Observation/Other Assessments   Focus on Therapeutic Outcomes (FOTO)  50% limitation      Sensation   Additional Comments tingling intermittently into Rt LE      Posture/Postural Control   Posture Comments head forward; shoulders rounded and elevated; increased thoracic kyphosis; weight shifted to the Lt in standing     AROM   AROM Assessment Site --  pain with al motions greatest w/flexion; Rt lat flex& rot    Lumbar Flexion 25%   Lumbar Extension 25%   Lumbar - Right Side Bend 65%   Lumbar -  Left Side Bend 75%   Lumbar - Right Rotation 10%   Lumbar - Left Rotation 25%     Strength   Right/Left Hip --  pain with resistive testing Rt LE    Right Hip Flexion 3-/5   Right Hip Extension 3/5   Right Hip ABduction 3-/5   Left Hip Flexion 4+/5   Left Hip Extension 4+/5   Left Hip ABduction 4+/5   Right/Left Knee --  5/5 bilat      Flexibility   Hamstrings tight and painful Rt 65 deg; Lt 75 deg    Quadriceps tight Rt > Lt    ITB tight Rt > Lt    Piriformis tight Rt > Lt      Palpation   Spinal mobility hypomobile; painful lumbar spine with CPA mobs; Rt lateral mobs    Palpation comment significant tightness Rt lumbar paraspinals; QL; lats; into thoracic paraspinals; Rt piriformis; gluts      Special Tests    Special Tests --  pain into the Rt anterior thigh with figure 4 testing             Objective measurements completed on examination: See above findings.          [facility] PT Treatment/Exercise - 08/04/16 0001      Self-Care   Self-Care --  initiated back care education      Lumbar Exercises: Stretches   Passive Hamstring Stretch 3 reps;30 seconds   Standing Extension 2 reps  2-3 sec    Standing Extension Limitations improved trunkextension with decreased pain following exercise    Prone on Elbows Stretch 2 reps;60 seconds  chest supported on pillow    Press Ups --  2-3 sec x 5 - some discomfort in Rt upper quadrant   Press Ups Limitations patient encouraged to try press up if she can control Rt UE symtpoms from old shoudler irritation/pain    Piriformis Stretch 3 reps;30 seconds  travell supine with strap     Lumbar Exercises: Supine   Ab Set --  3 part core 10 sec x 10      Moist Heat Therapy   Number Minutes Moist Heat 20 Minutes   Moist Heat Location Lumbar Spine;Shoulder  Rt shoulder      Electrical Stimulation   Electrical Stimulation Location lumbar to Rt hip    Electrical Stimulation Action IFC   Electrical Stimulation  Parameters to tolerance   Electrical Stimulation Goals Pain;Tone  PT Education - 08/04/16 1625    Education provided Yes   Education Details TENS; back care; HEP    Person(s) Educated Patient   Methods Explanation;Demonstration;Tactile cues;Verbal cues;Handout   Comprehension Verbalized understanding;Returned demonstration;Verbal cues required;Tactile cues required             PT Long Term Goals - 08/04/16 1644      PT LONG TERM GOAL #1   Title Improve posture and alignment with patient to demonstrate equal wt bearing on bilat LE's 09/15/16   Time 6   Period Weeks   Status New     PT LONG TERM GOAL #2   Title Increase trunk and LE mobilty and ROM to WFL's throughout 09/15/16   Time 6   Period Weeks   Status New     PT LONG TERM GOAL #3   Title Increase strength Rt LE to 5-/5 to 5/5 with minimal pain 09/15/17   Time 6   Period Weeks   Status New     PT LONG TERM GOAL #4   Title Independent in HEP 09/15/16   Time 6   Period Weeks   Status New     PT LONG TERM GOAL #5   Title Improve FOTO to </= 29% limitation 09/15/16   Time 6   Period Weeks   Status New                Plan - 08/04/16 1639    Clinical Impression Statement Patient presents with Rt LBP with Rt LE radicular pain of ~ 6 weeks duration. Symtpoms started following a lift at work and have increased following a fall ~ 2 weeks ago. Patient has poor posture and algignment; limited trunk and LE ROM/mobility; pain and decreased strength with resistive testing Rt LE; muscular tightness to palpatioin through Rt thoracolumbar paraspinals; lats; piriformis; gluts as well as tightness through Rt upper quarter. Patient has pain limiting functional activities and rest/sleep. Symptoms are consistent with discogenic and muscular orgin.    Clinical Presentation Evolving   Clinical Decision Making Low   Rehab Potential Good   PT Frequency 2x / week   PT Duration 6 weeks   PT  Treatment/Interventions Patient/family education;ADLs/Self Care Home Management;Cryotherapy;Electrical Stimulation;Iontophoresis 4mg /ml Dexamethasone;Moist Heat;Traction;Ultrasound;Dry needling;Manual techniques;Therapeutic activities;Therapeutic exercise;Neuromuscular re-education   PT Next Visit Plan postural correction; extension program; back education; manual work through thoracolumbar and Rt hip musculature; lumbar mobs; back education; modalities as indicated    Consulted and Agree with Plan of Care Patient      Patient will benefit from skilled therapeutic intervention in order to improve the following deficits and impairments:  Postural dysfunction, Improper body mechanics, Pain, Increased muscle spasms, Increased fascial restricitons, Decreased range of motion, Decreased mobility, Decreased strength, Decreased activity tolerance  Visit Diagnosis: Acute right-sided low back pain, with sciatica presence unspecified - Plan: PT plan of care cert/re-cert  Pain of right lower extremity - Plan: PT plan of care cert/re-cert  Other symptoms and signs involving the musculoskeletal system - Plan: PT plan of care cert/re-cert  Muscle weakness (generalized) - Plan: PT plan of care cert/re-cert     Problem List Patient Active Problem List   Diagnosis Date Noted  . Acute right-sided low back pain with right-sided sciatica 08/02/2016  . Lumbar facet arthropathy ([place]) 08/02/2016  . Anterolisthesis 08/02/2016  . Vasomotor flushing 06/13/2016  . Premature menopause 06/13/2016  . Postmenopausal atrophic vaginitis 04/27/2016  . Gastroesophageal reflux disease 04/25/2016  . BPPV (benign paroxysmal positional vertigo) 04/13/2016  Harvey, MPH  08/04/2016, 4:48 PM  [facility] [place], [place], [address] Phone: [phone]   Fax:  [phone]  Name: Frances Taylor MRN: [id number] Date of Birth:  May 05, 1960

## 2016-08-04 NOTE — Patient Instructions (Signed)
Abdominal Bracing With Pelvic Floor (Hook-Lying)    With neutral spine, tighten pelvic floor and abdominals sucking belly button to back bone; tighten muscles in low back at waist; exhale. Hold 10 sec Repeat _10__ times. Do _several__ times a day. Progress to do this in sitting; standing; walking and with functional activities.  HIP: Hamstrings - Supine   Place strap around foot. Raise leg up, keeping knee straight.  Bend opposite knee to protect back if indicated. Hold 30 seconds. 3 reps per set, 2-3 sets per day   Piriformis Stretch   Lying on back, pull right knee toward opposite shoulder. Hold 30 seconds. Repeat 3 times. Do 2-3 sessions per day.   Trunk: Prone Extension (Press-Ups)    Lie on stomach on firm, flat surface. Relax bottom and legs. Raise chest in air with elbows straight. Keep hips flat on surface, sag stomach. Hold __1-2__ seconds. Repeat __10__ times. Do __3-4__ sessions per day. CAUTION: Movement should be gentle and slow. No pain  Try doing this if you have leg pain    On Elbows (Prone)    Rise up on elbows as high as possible, keeping hips on floor. Hold __1-3 minutes  Do ___several_ sessions per day.  Trunk Extension    Standing, place back of open hands on low back. Straighten spine then arch the back and move shoulders back. Repeat ___2-3_ times per session. Do __several__ sessions per day   TENS UNIT: This is helpful for muscle pain and spasm.   Search and Purchase a TENS 7000 2nd edition at www.tenspros.com. It should be less than $30.     TENS unit instructions: Do not shower or bathe with the unit on Turn the unit off before removing electrodes or batteries If the electrodes lose stickiness add a drop of water to the electrodes after they are disconnected from the unit and place on plastic sheet. If you continued to have difficulty, call the TENS unit company to purchase more electrodes. Do not apply lotion on the skin area prior to  use. Make sure the skin is clean and dry as this will help prolong the life of the electrodes. After use, always check skin for unusual red areas, rash or other skin difficulties. If there are any skin problems, does not apply electrodes to the same area. Never remove the electrodes from the unit by pulling the wires. Do not use the TENS unit or electrodes other than as directed. Do not change electrode placement without consultating your therapist or physician. Keep 2 fingers with between each electrode.     Sleeping on Back  Place pillow under knees. A pillow with cervical support and a roll around waist are also helpful. Copyright  VHI. All rights reserved.  Sleeping on Side Place pillow between knees. Use cervical support under neck and a roll around waist as needed. Copyright  VHI. All rights reserved.   Sleeping on Stomach   If this is the only desirable sleeping position, place pillow under lower legs, and under stomach or chest as needed.  Posture - Sitting   Sit upright, head facing forward. Try using a roll to support lower back. Keep shoulders relaxed, and avoid rounded back. Keep hips level with knees. Avoid crossing legs for long periods. Stand to Sit / Sit to Stand   To sit: Bend knees to lower self onto front edge of chair, then scoot back on seat. To stand: Reverse sequence by placing one foot forward, and scoot to front of  seat. Use rocking motion to stand up.   Work Height and Reach  Ideal work height is no more than 2 to 4 inches below elbow level when standing, and at elbow level when sitting. Reaching should be limited to arm's length, with elbows slightly bent.  Bending  Bend at hips and knees, not back. Keep feet shoulder-width apart.    Posture - Standing   Good posture is important. Avoid slouching and forward head thrust. Maintain curve in low back and align ears over shoul- ders, hips over ankles.  Alternating Positions   Alternate tasks  and change positions frequently to reduce fatigue and muscle tension. Take rest breaks. Computer Work   Position work to Programmer, multimedia. Use proper work and seat height. Keep shoulders back and down, wrists straight, and elbows at right angles. Use chair that provides full back support. Add footrest and lumbar roll as needed.  Getting Into / Out of Car  Lower self onto seat, scoot back, then bring in one leg at a time. Reverse sequence to get out.  Dressing  Lie on back to pull socks or slacks over feet, or sit and bend leg while keeping back straight.    Housework - Sink  Place one foot on ledge of cabinet under sink when standing at sink for prolonged periods.   Pushing / Pulling  Pushing is preferable to pulling. Keep back in proper alignment, and use leg muscles to do the work.  Deep Squat   Squat and lift with both arms held against upper trunk. Tighten stomach muscles without holding breath. Use smooth movements to avoid jerking.  Avoid Twisting   Avoid twisting or bending back. Pivot around using foot movements, and bend at knees if needed when reaching for articles.  Carrying Luggage   Distribute weight evenly on both sides. Use a cart whenever possible. Do not twist trunk. Move body as a unit.   Lifting Principles .Maintain proper posture and head alignment. .Slide object as close as possible before lifting. .Move obstacles out of the way. .Test before lifting; ask for help if too heavy. .Tighten stomach muscles without holding breath. .Use smooth movements; do not jerk. .Use legs to do the work, and pivot with feet. .Distribute the work load symmetrically and close to the center of trunk. .Push instead of pull whenever possible.   Ask For Help   Ask for help and delegate to others when possible. Coordinate your movements when lifting together, and maintain the low back curve.  Log Roll   Lying on back, bend left knee and place left arm across chest.  Roll all in one movement to the right. Reverse to roll to the left. Always move as one unit. Housework - Sweeping  Use long-handled equipment to avoid stooping.   Housework - Wiping  Position yourself as close as possible to reach work surface. Avoid straining your back.  Laundry - Unloading Wash   To unload small items at bottom of washer, lift leg opposite to arm being used to reach.  Baker close to area to be raked. Use arm movements to do the work. Keep back straight and avoid twisting.     Cart  When reaching into cart with one arm, lift opposite leg to keep back straight.   Getting Into / Out of Bed  Lower self to lie down on one side by raising legs and lowering head at the same time. Use arms to assist moving without twisting. [employer]  both knees to roll onto back if desired. To sit up, start from lying on side, and use same move-ments in reverse. Housework - Vacuuming  Hold the vacuum with arm held at side. Step back and forth to move it, keeping head up. Avoid twisting.   Laundry - IT consultant so that bending and twisting can be avoided.   Laundry - Unloading Dryer  Squat down to reach into clothes dryer or use a reacher.  Gardening - Weeding / Probation officer or Kneel. Knee pads may be helpful.

## 2016-08-15 ENCOUNTER — Ambulatory Visit (INDEPENDENT_AMBULATORY_CARE_PROVIDER_SITE_OTHER): Payer: Managed Care, Other (non HMO) | Admitting: Physical Therapy

## 2016-08-15 DIAGNOSIS — M79604 Pain in right leg: Secondary | ICD-10-CM

## 2016-08-15 DIAGNOSIS — M545 Low back pain: Secondary | ICD-10-CM | POA: Diagnosis not present

## 2016-08-15 DIAGNOSIS — M6281 Muscle weakness (generalized): Secondary | ICD-10-CM | POA: Diagnosis not present

## 2016-08-15 DIAGNOSIS — R29898 Other symptoms and signs involving the musculoskeletal system: Secondary | ICD-10-CM

## 2016-08-15 NOTE — Therapy (Signed)
[place], [place], [address] Phone: [phone]   Fax:  [phone]  Physical Therapy Treatment  Patient Details  Name: Frances Taylor MRN: [id number] Date of Birth: 26-Oct-1960 Referring Provider: Nelson Chimes, PA-C   Encounter Date: 08/15/2016      PT End of Session - 08/15/16 1648    Visit Number 2   Number of Visits 12   Date for PT Re-Evaluation 09/15/16   PT Start Time 1604   PT Stop Time 1659   PT Time Calculation (min) 55 min   Activity Tolerance Patient limited by pain   Behavior During Therapy [facility] for tasks assessed/performed      Past Medical History:  Diagnosis Date  . Cervical high risk HPV (human papillomavirus) test positive   . GERD (gastroesophageal reflux disease)   . Headache   . Hypertension    took herself off medication, lifestyle    Past Surgical History:  Procedure Laterality Date  . IRRIGATION AND DEBRIDEMENT SEBACEOUS CYST     scalp    There were no vitals filed for this visit.      Subjective Assessment - 08/15/16 1600    Subjective [name] reports her Rt neck and UE has been agrivated since last visit.  She reports she has completed HEP 1x/day.     Currently in Pain? Yes   Pain Score 6    Pain Location Back   Pain Orientation Right   Pain Descriptors / Indicators Aching   Pain Radiating Towards down Rt LE to foot   Pain Frequency Constant   Aggravating Factors  sitting, standing   Pain Relieving Factors medication, hot shower   Multiple Pain Sites Yes   Pain Location Shoulder   Pain Orientation Right   Pain Descriptors / Indicators Burning;Throbbing   Aggravating Factors  picking up stuff.    Pain Relieving Factors hot shower, heating pad, medicine         [facility] Adult PT Treatment/Exercise - 08/15/16 0001      Self-Care   Self-Care Other Self-Care Comments   Other Self-Care Comments  Educated pt on self massage with ball to Rt post  shoulder girdle; pt verbalized understanding and returned demo.  Also encouraged pt to use lumbar support in seats.      Lumbar Exercises: Stretches   Passive Hamstring Stretch 3 reps;30 seconds   Standing Extension 8 reps  2-3 sec    Prone on Elbows Stretch 1 rep;10 seconds  unable to tolerate this due to pain in Rt shoulder   Piriformis Stretch 1 rep;60 seconds  travell supine with strap, unable to tolerate due to increased Rt shoulder symptoms     Lumbar Exercises: Aerobic   Stationary Bike NuStep L4: 6 min. (just legs, arms increased shoulder pain)     Lumbar Exercises: Supine   Ab Set 5 reps  10 sec.    Other Supine Lumbar Exercises snow angels in hooklying x 10 reps; scap squeeze x 5 sec x 10 reps      Shoulder Exercises: Standing   Other Standing Exercises scap squeeze against pool noodle, with axial ext x 5 reps - pt reported increased Rt shoulder pain.      Shoulder Exercises: Stretch   Other Shoulder Stretches low and midlevel doorway stretch x 2  reps each position x 30 sec      Moist Heat Therapy   Number Minutes Moist Heat 15 Minutes   Moist Heat Location Lumbar Spine;Shoulder  Rt shoulder      Electrical Stimulation   Electrical Stimulation Location Rt shoulder (post); Rt lumbar paraspinals   Electrical Stimulation Action IFC   Electrical Stimulation Parameters to tolerance    Electrical Stimulation Goals Pain                     PT Long Term Goals - 08/15/16 1649      PT LONG TERM GOAL #1   Title Improve posture and alignment with patient to demonstrate equal wt bearing on bilat LE's 09/15/16   Time 6   Status On-going     PT LONG TERM GOAL #2   Title Increase trunk and LE mobilty and ROM to WFL's throughout 09/15/16   Time 6   Period Weeks   Status On-going     PT LONG TERM GOAL #3   Title Increase strength Rt LE to 5-/5 to 5/5 with minimal pain 09/15/17   Time 6   Period Weeks   Status On-going     PT LONG TERM GOAL #4   Title  Independent in HEP 09/15/16   Time 6   Period Weeks   Status On-going     PT LONG TERM GOAL #5   Title Improve FOTO to </= 29% limitation 09/15/16   Time 6   Period Weeks   Status On-going               Plan - 08/15/16 1649    Clinical Impression Statement Pt had limited tolerance for supine and standing exercise due to pain radiating into RUE.  Pt required freq cues to return head to neutral position (laterally flexed Lt).  Pt reported feeling of nausea with trial of self massage with ball against post Rt shoulder girdle.  No goals met yet; only 2nd visit.    Rehab Potential Good   PT Frequency 2x / week   PT Duration 6 weeks   PT Treatment/Interventions Patient/family education;ADLs/Self Care Home Management;Cryotherapy;Electrical Stimulation;Iontophoresis 81m/ml Dexamethasone;Moist Heat;Traction;Ultrasound;Dry needling;Manual techniques;Therapeutic activities;Therapeutic exercise;Neuromuscular re-education   PT Next Visit Plan postural correction; extension program; back education; manual work through thoracolumbar and Rt hip musculature;  back education; modalities as indicated    Consulted and Agree with Plan of Care Patient      Patient will benefit from skilled therapeutic intervention in order to improve the following deficits and impairments:  Postural dysfunction, Improper body mechanics, Pain, Increased muscle spasms, Increased fascial restricitons, Decreased range of motion, Decreased mobility, Decreased strength, Decreased activity tolerance  Visit Diagnosis: Acute right-sided low back pain, with sciatica presence unspecified  Pain of right lower extremity  Other symptoms and signs involving the musculoskeletal system  Muscle weakness (generalized)     Problem List Patient Active Problem List   Diagnosis Date Noted  . Acute right-sided low back pain with right-sided sciatica 08/02/2016  . Lumbar facet arthropathy ([place] 08/02/2016  . Anterolisthesis  08/02/2016  . Vasomotor flushing 06/13/2016  . Premature menopause 06/13/2016  . Postmenopausal atrophic vaginitis 04/27/2016  . Gastroesophageal reflux disease 04/25/2016  . BPPV (benign paroxysmal positional vertigo) 04/13/2016   JKerin Perna PTA 08/15/16 4:56 PM   [place] [address]Phone: [phone]  Fax:  [phone] Name: ADawnielle ChristianaMRN: [id number] of Birth: 712-28-1962

## 2016-08-17 ENCOUNTER — Ambulatory Visit (INDEPENDENT_AMBULATORY_CARE_PROVIDER_SITE_OTHER): Payer: Managed Care, Other (non HMO) | Admitting: Rehabilitative and Restorative Service Providers"

## 2016-08-17 ENCOUNTER — Encounter: Payer: Self-pay | Admitting: Rehabilitative and Restorative Service Providers"

## 2016-08-17 DIAGNOSIS — M545 Low back pain: Secondary | ICD-10-CM

## 2016-08-17 DIAGNOSIS — M79604 Pain in right leg: Secondary | ICD-10-CM

## 2016-08-17 DIAGNOSIS — R29898 Other symptoms and signs involving the musculoskeletal system: Secondary | ICD-10-CM

## 2016-08-17 DIAGNOSIS — M6281 Muscle weakness (generalized): Secondary | ICD-10-CM | POA: Diagnosis not present

## 2016-08-17 NOTE — Therapy (Signed)
[place], [place], [address] Phone: [phone]   Fax:  [phone]  Physical Therapy Treatment  Patient Details  Name: Frances Taylor MRN: [id number] Date of Birth: September 12, 1960 Referring Provider: Nelson Chimes, PA-C   Encounter Date: 08/17/2016      PT End of Session - 08/17/16 1610    Visit Number 3   Number of Visits 12   Date for PT Re-Evaluation 09/15/16   PT Start Time 1829   PT Stop Time 9371   PT Time Calculation (min) 55 min   Activity Tolerance Patient tolerated treatment well      Past Medical History:  Diagnosis Date  . Cervical high risk HPV (human papillomavirus) test positive   . GERD (gastroesophageal reflux disease)   . Headache   . Hypertension    took herself off medication, lifestyle    Past Surgical History:  Procedure Laterality Date  . IRRIGATION AND DEBRIDEMENT SEBACEOUS CYST     scalp    There were no vitals filed for this visit.      Subjective Assessment - 08/17/16 1610    Subjective Frances Taylor reports that she continues to have pain in the Rt neck/side/back. She feels pain when she is driving and uses Rt LE on gas. She is continuing with her HEP and has used the icy hot at home. No improvement and symptoms seem to be getting worse. Patient reports that the medication does not do any good. She has not slept in the past 4 days.    Currently in Pain? Yes   Pain Score 8    Pain Location Back   Pain Orientation Right   Pain Descriptors / Indicators Aching;Sharp;Shooting;Dull;Tightness   Pain Type Acute pain   Pain Radiating Towards down in to the Rt LE to foot - pain up into the Rt side to neck and shoulder area    Pain Onset More than a month ago   Pain Frequency Constant   Pain Score 8   Pain Location Shoulder   Pain Orientation Right   Pain Descriptors / Indicators Aching;Sharp;Throbbing   Pain Type Acute pain                          [facility] Adult PT Treatment/Exercise - 08/17/16 0001      Lumbar Exercises: Stretches   Passive Hamstring Stretch 3 reps;30 seconds   Standing Extension 2 reps  2-3 sec      Lumbar Exercises: Aerobic   Stationary Bike NuStep L4: 6 min. LE only      Lumbar Exercises: Supine   Ab Set 5 reps  10 sec.      Shoulder Exercises: Standing   Other Standing Exercises scap squeeze against pool noodle, with axial ext x 5 reps - pt reported increased Rt shoulder pain.      Shoulder Exercises: Stretch   Other Shoulder Stretches low and midlevel doorway stretch x 2  reps each position x 30 sec      Moist Heat Therapy   Number Minutes Moist Heat 20 Minutes   Moist Heat Location Lumbar Spine;Shoulder;Cervical  Rt shoulder      Electrical Stimulation   Electrical Stimulation Location Rt shoulder (post); Rt lumbar paraspinals   Electrical Stimulation Parameters to tolerance   Electrical Stimulation Goals Pain;Tone     Manual Therapy   Manual therapy comments pt prone    Joint Mobilization gentle CPA mobs lumbar spine tight/tender  L4/5/S1   Soft tissue mobilization mild to moderate pressure with soft tissue mobilization through Rt side - upper trap/leveator/periscapular/lats/Rt thoracolumbar paraspinals/Rt posterior hip    Myofascial Release Rt thoracolumbar paraspinals                      PT Long Term Goals - 08/15/16 1649      PT LONG TERM GOAL #1   Title Improve posture and alignment with patient to demonstrate equal wt bearing on bilat LE's 09/15/16   Time 6   Status On-going     PT LONG TERM GOAL #2   Title Increase trunk and LE mobilty and ROM to WFL's throughout 09/15/16   Time 6   Period Weeks   Status On-going     PT LONG TERM GOAL #3   Title Increase strength Rt LE to 5-/5 to 5/5 with minimal pain 09/15/17   Time 6   Period Weeks   Status On-going     PT LONG TERM GOAL #4   Title Independent in HEP 09/15/16   Time 6    Period Weeks   Status On-going     PT LONG TERM GOAL #5   Title Improve FOTO to </= 29% limitation 09/15/16   Time 6   Period Weeks   Status On-going               Plan - 08/17/16 1709    Clinical Impression Statement Continued pain with increase in intensity noted. Patient reports constant pain and inability to sleep due to pain. Pain continues in low back and Rt LE but is also into the Rt thoracic and shoulder girdle area. Even gentle exercises irritate symptoms. Modlaities provide temporary improvement in pain but not lasting change. Patient was advised to contact PA to schedule follow up appointment before scheduled appointment 08/30/16.    Rehab Potential Good   PT Frequency 2x / week   PT Duration 6 weeks   PT Treatment/Interventions Patient/family education;ADLs/Self Care Home Management;Cryotherapy;Electrical Stimulation;Iontophoresis 4mg /ml Dexamethasone;Moist Heat;Traction;Ultrasound;Dry needling;Manual techniques;Therapeutic activities;Therapeutic exercise;Neuromuscular re-education   PT Next Visit Plan postural correction; extension program; back education; manual work through thoracolumbar and Rt hip musculature;  back education; modalities as indicated. Patient will contact MD office for appointment.    Consulted and Agree with Plan of Care Patient      Patient will benefit from skilled therapeutic intervention in order to improve the following deficits and impairments:  Postural dysfunction, Improper body mechanics, Pain, Increased muscle spasms, Increased fascial restricitons, Decreased range of motion, Decreased mobility, Decreased strength, Decreased activity tolerance  Visit Diagnosis: Acute right-sided low back pain, with sciatica presence unspecified  Pain of right lower extremity  Other symptoms and signs involving the musculoskeletal system  Muscle weakness (generalized)     Problem List Patient Active Problem List   Diagnosis Date Noted  . Acute  right-sided low back pain with right-sided sciatica 08/02/2016  . Lumbar facet arthropathy ([place]) 08/02/2016  . Anterolisthesis 08/02/2016  . Vasomotor flushing 06/13/2016  . Premature menopause 06/13/2016  . Postmenopausal atrophic vaginitis 04/27/2016  . Gastroesophageal reflux disease 04/25/2016  . BPPV (benign paroxysmal positional vertigo) 04/13/2016    Meryl Ponder Nilda Simmer PT, MPH  08/17/2016, 5:15 PM  [facility] [place], [place], [address] Phone: [phone]   Fax:  [phone]  Name: Reya Aurich MRN: [id number] Date of Birth: May 26, 1960

## 2016-08-19 ENCOUNTER — Ambulatory Visit (INDEPENDENT_AMBULATORY_CARE_PROVIDER_SITE_OTHER): Payer: Managed Care, Other (non HMO)

## 2016-08-19 ENCOUNTER — Encounter: Payer: Self-pay | Admitting: Physician Assistant

## 2016-08-19 ENCOUNTER — Ambulatory Visit (INDEPENDENT_AMBULATORY_CARE_PROVIDER_SITE_OTHER): Payer: Managed Care, Other (non HMO) | Admitting: Physician Assistant

## 2016-08-19 VITALS — BP 121/72 | HR 83 | Temp 97.9°F | Resp 17 | Wt 160.0 lb

## 2016-08-19 DIAGNOSIS — M25511 Pain in right shoulder: Secondary | ICD-10-CM

## 2016-08-19 DIAGNOSIS — M7541 Impingement syndrome of right shoulder: Secondary | ICD-10-CM | POA: Insufficient documentation

## 2016-08-19 MED ORDER — CYCLOBENZAPRINE HCL 10 MG PO TABS: 10.0000 mg | ORAL_TABLET | Freq: Every evening | ORAL | 0 refills | Status: DC | PRN

## 2016-08-19 MED ORDER — PREDNISONE 50 MG PO TABS: ORAL_TABLET | ORAL | 0 refills | Status: DC

## 2016-08-19 NOTE — Progress Notes (Signed)
HPI:                                                                Frances Taylor is a 55 y.o. female who presents to [facility] today for arm pain  Patient reports right arm pain x 2 weeks. Pain is moderate, persistent, worse at night. She also endorses paresthesias in her right arm and hand. Denies known injury or trauma. She has been taking gabapentin, meloxicam and hydrocodone prescribed for back pain, but it does not seem to help her shoulder.  Past Medical History:  Diagnosis Date  . Cervical high risk HPV (human papillomavirus) test positive   . GERD (gastroesophageal reflux disease)   . Headache   . Hypertension    took herself off medication, lifestyle   Past Surgical History:  Procedure Laterality Date  . IRRIGATION AND DEBRIDEMENT SEBACEOUS CYST     scalp   Social History  Substance Use Topics  . Smoking status: Never Smoker  . Smokeless tobacco: Never Used  . Alcohol use No   family history is not on file.  ROS: negative except as noted in the HPI  Medications: Current Outpatient Prescriptions  Medication Sig Dispense Refill  . gabapentin (NEURONTIN) 100 MG capsule Take 1 capsule (100 mg total) by mouth 2 (two) times daily. 60 capsule 11  . HYDROcodone-acetaminophen (NORCO/VICODIN) 5-325 MG tablet Take by mouth.    . Lactobacillus-Inulin ([place] PO) Take by mouth.    . meloxicam (MOBIC) 15 MG tablet Take 1 tablet (15 mg total) by mouth daily. 30 tablet 2  . Multiple Vitamins-Minerals (MULTIVITAMIN ADULTS 50+) TABS Take by mouth.    . cyclobenzaprine (FLEXERIL) 10 MG tablet Take 1 tablet (10 mg total) by mouth at bedtime as needed for muscle spasms. 30 tablet 0  . predniSONE (DELTASONE) 50 MG tablet One tab PO daily for 5 days. 5 tablet 0   No current facility-administered medications for this visit.    No Known Allergies     Objective:  BP 121/72   Pulse 83   Temp 97.9 F  (36.6 C) (Oral)   Resp 17   Wt 160 lb (72.6 kg)   SpO2 96%   BMI 26.63 kg/m  Gen: well-groomed, cooperative, not ill-appearing, no distress HEENT: normal conjunctiva, wearing glasses, trachea midline Pulm: Normal work of breathing, normal phonation Neuro: alert and oriented x 3, sensation grossly intact, normal tone, no tremor MSK: right shoulder: atraumatic, normal tone, tenderness of the AC joint, decreased flexion at 60 degrees, positive Neers, positive Hawkins, strength 4/5 in the right shoulder, compared to 5/5 in the left Skin: warm, dry, intact; no rashes on exposed skin  No results found for this or any previous visit (from the past 72 hour(s)). No results found.   Assessment and Plan: 56 y.o. female with   1. Impingement syndrome of right shoulder - Shoulder x-ray today - Prednisone once a day for 5 days - Gabapentin 300mg  at bedtime - Flexeril 10mg  as needed at night - Rehab exercises daily - Follow-up with Sports Medicine in 2 weeks  Patient education and anticipatory guidance given Patient agrees with treatment plan   Darlyne Russian PA-C

## 2016-08-19 NOTE — Patient Instructions (Addendum)
- Shoulder x-ray today - Prednisone once a day for 5 days - Gabapentin 300mg  at bedtime - Flexeril 10mg  as needed at night - Rehab exercises daily - Follow-up with Sports Medicine in 2 weeks   Shoulder Impingement Syndrome Shoulder impingement syndrome is a condition that causes pain when connective tissues (tendons) surrounding the shoulder joint become pinched. These tendons are part of the group of muscles and tissues that help to stabilize the shoulder (rotator cuff). Beneath the rotator cuff is a fluid-filled sac (bursa) that allows the muscles and tendons to glide smoothly. The bursa may become swollen or irritated (bursitis). Bursitis, swelling in the rotator cuff tendons, or both conditions can decrease how much space is under a bone in the shoulder joint (acromion), resulting in impingement. What are the causes? Shoulder impingement syndrome can be caused by bursitis or swelling of the rotator cuff tendons, which may result from:  Repetitive overhead arm movements.  Falling onto the shoulder.  Weakness in the shoulder muscles.  What increases the risk? You may be more likely to develop this condition if you are an athlete who participates in:  Sports that involve throwing, such as baseball.  Tennis.  Swimming.  Volleyball.  Some people are also more likely to develop impingement syndrome because of the shape of their acromion bone. What are the signs or symptoms? The main symptom of this condition is pain on the front or side of the shoulder. Pain may:  Get worse when lifting or raising the arm.  Get worse at night.  Wake you up from sleeping.  Feel sharp when the shoulder is moved, and then fade to an ache.  Other signs and symptoms may include:  Tenderness.  Stiffness.  Inability to raise the arm above shoulder level or behind the body.  Weakness.  How is this diagnosed? This condition may be diagnosed based on:  Your symptoms.  Your medical  history.  A physical exam.  Imaging tests, such as: ? X-rays. ? MRI. ? Ultrasound.  How is this treated? Treatment for this condition may include:  Resting your shoulder and avoiding all activities that cause pain or put stress on the shoulder.  Icing your shoulder.  NSAIDs to help reduce pain and swelling.  One or more injections of medicines to numb the area and reduce inflammation.  Physical therapy.  Surgery. This may be needed if nonsurgical treatments have not helped. Surgery may involve repairing the rotator cuff, reshaping the acromion, or removing the bursa.  Follow these instructions at home: Managing pain, stiffness, and swelling  If directed, apply ice to the injured area. ? Put ice in a plastic bag. ? Place a towel between your skin and the bag. ? Leave the ice on for 20 minutes, 2-3 times a day. Activity  Rest and return to your normal activities as told by your health care provider. Ask your health care provider what activities are safe for you.  Do exercises as told by your health care provider. General instructions  Do not use any tobacco products, including cigarettes, chewing tobacco, or e-cigarettes. Tobacco can delay healing. If you need help quitting, ask your health care provider.  Ask your health care provider when it is safe for you to drive.  Take over-the-counter and prescription medicines only as told by your health care provider.  Keep all follow-up visits as told by your health care provider. This is important. How is this prevented?  Give your body time to rest between periods of  activity.  Be safe and responsible while being active to avoid falls.  Maintain physical fitness, including strength and flexibility. Contact a health care provider if:  Your symptoms have not improved after 1-2 months of treatment and rest.  You cannot lift your arm away from your body. This information is not intended to replace advice given to you by  your health care provider. Make sure you discuss any questions you have with your health care provider. Document Released: 01/10/2005 Document Revised: 09/17/2015 Document Reviewed: 12/13/2014 Elsevier Interactive Patient Education  [employer].

## 2016-08-22 NOTE — Progress Notes (Signed)
Normal shoulder x-ray. As discussed, this is expected with impingement syndrome, but it's important to have a baseline x-ray should you fail conservative treatment and require an MRI. Continue rehab exercises and antiinflammatories Follow-up with Sports Medicine in 2-4 weeks

## 2016-08-30 ENCOUNTER — Ambulatory Visit (INDEPENDENT_AMBULATORY_CARE_PROVIDER_SITE_OTHER): Payer: Managed Care, Other (non HMO) | Admitting: Sports Medicine

## 2016-08-30 ENCOUNTER — Ambulatory Visit (INDEPENDENT_AMBULATORY_CARE_PROVIDER_SITE_OTHER): Payer: Managed Care, Other (non HMO)

## 2016-08-30 DIAGNOSIS — M4314 Spondylolisthesis, thoracic region: Secondary | ICD-10-CM | POA: Diagnosis not present

## 2016-08-30 DIAGNOSIS — M5011 Cervical disc disorder with radiculopathy,  high cervical region: Secondary | ICD-10-CM | POA: Diagnosis not present

## 2016-08-30 DIAGNOSIS — M5412 Radiculopathy, cervical region: Secondary | ICD-10-CM

## 2016-08-30 DIAGNOSIS — M5416 Radiculopathy, lumbar region: Secondary | ICD-10-CM

## 2016-08-30 MED ORDER — MELOXICAM 15 MG PO TABS: ORAL_TABLET | ORAL | 3 refills | Status: DC

## 2016-08-30 NOTE — Progress Notes (Signed)
   Subjective:    I'm seeing this patient as a consultation for:  Nelson Chimes, PA-C  CC: Right leg pain, right shoulder pain  HPI: Right shoulder pain: This is a pleasant 56 year old female, she has had right shoulder pain for months now, on further questioning she did have an MRI which she did not entirely remember of her cervical spine at [facility], this showed fairly large right-sided disc protrusions at multiple levels causing significant indentation of the thecal sac. Since then she's had pain that she notes around the medial aspect of her right shoulder blade with occasional radiation down to the hand, she endorses paresthesias of all the fingers of her right hand. No progressive weakness, she's not dropping things. She endorses worsening of pain with ipsilateral rotation of her neck.  Right leg pain: Also having pain in her back with radiation down the lateral aspect of her thigh, anterior aspect of the knee, anterior shin to the big toe. Worse with sitting, flexion, Valsalva, no constitutional symptoms, bowel or bladder distention, saddle numbness.  Past medical history, Surgical history, Family history not pertinant except as noted below, Social history, Allergies, and medications have been entered into the medical record, reviewed, and no changes needed.   Review of Systems: No headache, visual changes, nausea, vomiting, diarrhea, constipation, dizziness, abdominal pain, skin rash, fevers, chills, night sweats, weight loss, swollen lymph nodes, body aches, joint swelling, muscle aches, chest pain, shortness of breath, mood changes, visual or auditory hallucinations.   Objective:   General: Well Developed, well nourished, and in no acute distress.  Neuro:  Extra-ocular muscles intact, able to move all 4 extremities, sensation grossly intact.  Deep tendon reflexes tested were normal. Psych: Alert and oriented, mood congruent with affect. ENT:  Ears and nose appear unremarkable.   Hearing grossly normal. Neck: Unremarkable overall appearance, trachea midline.  No visible thyroid enlargement. Eyes: Conjunctivae and lids appear unremarkable.  Pupils equal and round. Skin: Warm and dry, no rashes noted.  Cardiovascular: Pulses palpable, no extremity edema. Neck: Negative spurling's Full neck range of motion Grip strength and sensation normal in bilateral hands Strength good C4 to T1 distribution No sensory change to C4 to T1 Reflexes normal Negative Hoffmann sign bilaterally. Back Exam:  Inspection: Unremarkable  Motion: Flexion 45 deg, Extension 45 deg, Side Bending to 45 deg bilaterally,  Rotation to 45 deg bilaterally  SLR laying: Negative  XSLR laying: Negative  Palpable tenderness: None. FABER: negative. Sensory change: Gross sensation intact to all lumbar and sacral dermatomes.  Reflexes: 2+ at both patellar tendons, 2+ at achilles tendons, Babinski's downgoing.  Strength at foot  Plantar-flexion: 5/5 Dorsi-flexion: 5/5 Eversion: 5/5 Inversion: 5/5  Leg strength  Quad: 5/5 Hamstring: 5/5 Hip flexor: 5/5 Hip abductors: 5/5  Gait unremarkable.  Impression and Recommendations:   This case required medical decision making of moderate complexity.  Right lumbar radiculitis Right L4 distribution radiculitis. X-rays, formal physical therapy, switching to meloxicam.  Symptoms did improve with prednisone.  Radiculitis of right cervical region MRI in 2015 showed fairly severe right-sided multilevel protruding discs. We are to start over, x-rays, formal physical therapy, meloxicam. Return to see me in 6 weeks, MRI for interventional planning if no better, MRI from 2015 is a bit too old to go off of now. No signs or symptoms of cord compression/myelopathy.

## 2016-08-30 NOTE — Assessment & Plan Note (Signed)
Right L4 distribution radiculitis. X-rays, formal physical therapy, switching to meloxicam.  Symptoms did improve with prednisone.

## 2016-08-30 NOTE — Assessment & Plan Note (Signed)
MRI in 2015 showed fairly severe right-sided multilevel protruding discs. We are to start over, x-rays, formal physical therapy, meloxicam. Return to see me in 6 weeks, MRI for interventional planning if no better, MRI from 2015 is a bit too old to go off of now. No signs or symptoms of cord compression/myelopathy.

## 2016-09-07 ENCOUNTER — Encounter: Payer: Self-pay | Admitting: Rehabilitative and Restorative Service Providers"

## 2016-09-07 ENCOUNTER — Ambulatory Visit (INDEPENDENT_AMBULATORY_CARE_PROVIDER_SITE_OTHER): Payer: 59 | Admitting: Rehabilitative and Restorative Service Providers"

## 2016-09-07 DIAGNOSIS — R29898 Other symptoms and signs involving the musculoskeletal system: Secondary | ICD-10-CM | POA: Diagnosis not present

## 2016-09-07 DIAGNOSIS — M25511 Pain in right shoulder: Secondary | ICD-10-CM

## 2016-09-07 DIAGNOSIS — M542 Cervicalgia: Secondary | ICD-10-CM

## 2016-09-07 DIAGNOSIS — M6281 Muscle weakness (generalized): Secondary | ICD-10-CM

## 2016-09-07 NOTE — Patient Instructions (Signed)
Axial Extension (Chin Tuck)    Pull chin in and lengthen back of neck. Hold _10___ seconds while counting out loud. Repeat __5__ times. Do _several___ sessions per day.   Shoulder Blade Squeeze    Rotate shoulders back, then squeeze shoulder blades down and back. Hold 10 sec Repeat __10__ times. Do _3-4___ sessions per day.  Scapula Adduction With Pectoralis Stretch: Low - Standing   Shoulders at 45 hands even with shoulders, keeping weight through legs, shift weight forward until you feel pull or stretch through the front of your chest. Hold _30__ seconds. Do _3__ times, _2-4__ times per day.   Scapula Adduction With Pectoralis Stretch: Mid-Range - Standing   Shoulders at 90 elbows even with shoulders, keeping weight through legs, shift weight forward until you feel pull or strength through the front of your chest. Hold __30_ seconds. Do _3__ times, __2-4_ times per day.   Scapula Adduction With Pectoralis Stretch: High - Standing   Shoulders at 120 hands up high on the doorway, keeping weight on feet, shift weight forward until you feel pull or stretch through the front of your chest. Hold _30__ seconds. Do _3__ times, _2-3__ times per day.  TENS UNIT: This is helpful for muscle pain and spasm.   Search and Purchase a TENS 7000 2nd edition at www.tenspros.com. It should be less than $30.     TENS unit instructions: Do not shower or bathe with the unit on Turn the unit off before removing electrodes or batteries If the electrodes lose stickiness add a drop of water to the electrodes after they are disconnected from the unit and place on plastic sheet. If you continued to have difficulty, call the TENS unit company to purchase more electrodes. Do not apply lotion on the skin area prior to use. Make sure the skin is clean and dry as this will help prolong the life of the electrodes. After use, always check skin for unusual red areas, rash or other skin difficulties.  If there are any skin problems, does not apply electrodes to the same area. Never remove the electrodes from the unit by pulling the wires. Do not use the TENS unit or electrodes other than as directed. Do not change electrode placement without consultating your therapist or physician. Keep 2 fingers with between each electrode.

## 2016-09-07 NOTE — Therapy (Signed)
[place], [place], [address] Phone: [phone]   Fax:  [phone]  Physical Therapy Evaluation  Patient Details  Name: Frances Taylor MRN: [id number] Date of Birth: [birth year] Referring Provider: Dr Dianah Field  Encounter Date: 09/07/2016      PT End of Session - 09/07/16 0804    Visit Number 4   Number of Visits 16   Date for PT Re-Evaluation 10/18/16   PT Start Time 0802   PT Stop Time 0858   PT Time Calculation (min) 56 min   Activity Tolerance Patient tolerated treatment well      Past Medical History:  Diagnosis Date  . Cervical high risk HPV (human papillomavirus) test positive   . GERD (gastroesophageal reflux disease)   . Headache   . Hypertension    took herself off medication, lifestyle    Past Surgical History:  Procedure Laterality Date  . IRRIGATION AND DEBRIDEMENT SEBACEOUS CYST     scalp    There were no vitals filed for this visit.       Subjective Assessment - 09/07/16 0813    Subjective Frances Taylor reports that the symptoms in her LB and Rt LE are improved. She continues to have some "nerve" shooting pain when she is driving at times. She has pain in the Rt shoulder running up into the Rt side of her neck. Symptoms in the shoulder and neck started ~ 3 weeks ago with no known injury. She has been treated with medication with some improvement. She will start PT and continue with medication and see how symtpoms respond.    How long can you sit comfortably? sits on a pillow which helps - limited sitting    How long can you stand comfortably? 20-30 min leaning head to the Lt    How long can you walk comfortably? 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 Yes   Pain Score 6    Pain Location Shoulder   Pain Orientation Right   Pain Descriptors /  Indicators Nagging   Pain Type Acute pain   Pain Radiating Towards shoulder into the Rt side of her neck    Pain Onset 1 to 4 weeks ago   Pain Frequency Constant   Aggravating Factors  any activity - not sure what aggravates symptoms - awakens her at night    Pain Relieving Factors medication; heat; icy hot   Multiple Pain Sites No   Pain Location Back   Pain Orientation Right   Pain Descriptors / Indicators Aching   Pain Type Acute pain   Pain Onset More than a month ago   Pain Frequency Intermittent   Aggravating Factors  pressing gas when driving    Pain Relieving Factors medication; heat             [facility] PT Assessment - 09/07/16 0001      Assessment   Medical Diagnosis Cervical radiculitis Rt shoulder pain; Rt sided LBP Rt sciatica   Referring Provider Dr Dianah Field   Onset Date/Surgical Date 06/24/16   Hand Dominance Right   Next MD Visit 10/11/16   Prior Therapy here for back x 3 visits      Balance Screen   Has the patient fallen in the past 6 months Yes   How many times? 2  Has the patient had a decrease in activity level because of a fear of falling?  No   Is the patient reluctant to leave their home because of a fear of falling?  No     Prior Function   Level of Independence Independent   Vocation Full time employment  working 9-10 hours/day 5 days/wk 2.5 years    [employer] - labels cardboard; bends down to lift pallets up; carries cardboard to bailer lifting ~ 30-40 # lifting to replace pallet every 2-3 hours - very active job requiring repetitive lifting throughout the day    Leisure household chores;      Observation/Other Assessments   Focus on Therapeutic Outcomes (FOTO)  61% limitation      Sensation   Additional Comments tingling intermittently into Rt LE      Posture/Postural Control   Posture Comments head forward; hread laterally flexed to Lt;  shoulders rounded and elevated; increased thoracic kyphosis; weight  shifted to the Lt in standing     AROM   Right/Left Shoulder --  shoulder assessed in standing - ER w/ shd/elbow 90 deg    Right Shoulder Extension 42 Degrees   Right Shoulder Flexion 134 Degrees   Right Shoulder ABduction 140 Degrees   Right Shoulder External Rotation 101 Degrees   Left Shoulder Extension 41 Degrees   Left Shoulder Flexion 140 Degrees   Left Shoulder ABduction 160 Degrees   Left Shoulder External Rotation 105 Degrees   Cervical Flexion 57   Cervical Extension 35   Cervical - Right Side Bend 36  painful    Cervical - Left Side Bend 31   Cervical - Right Rotation 55  painful   Cervical - Left Rotation 74     Strength   Overall Strength Comments bilat UE strength grossly 5/5      Palpation   Spinal mobility hypomobile; painful cervical spine with CPA mobs; lateral mobs    Palpation comment significant tightness Rt cervical paraspinals through thoracic paraspinals; Rt ant/lat/post cervical musculature            Objective measurements completed on examination: See above findings.                  PT Education - 09/07/16 0837    Education provided Yes   Education Details HEP; TENS    Person(s) Educated Patient   Methods Explanation;Demonstration;Tactile cues;Verbal cues;Handout   Comprehension Verbalized understanding;Returned demonstration;Verbal cues required;Tactile cues required             PT Long Term Goals - 09/07/16 0932      PT LONG TERM GOAL #1   Title Improve posture and alignment with patient to demonstrate upright posture and alignment with posterior shoudler girdle engaged 10/18/16   Time 6   Period Weeks   Status New     PT LONG TERM GOAL #2   Title Increase cervical and Rt UE mobilty and ROM to WFL's throughout 10/18/16   Time 6   Period Weeks   Status New     PT LONG TERM GOAL #3   Title Decrease pain in Rt shoulder with functional activities to 0/10 to 2/10  by 10/18/16   Time 6   Period Weeks   Status New      PT LONG TERM GOAL #4   Title Independent in HEP 10/18/16/18   Time 6   Period Weeks   Status New     PT LONG TERM GOAL #5  Title Improve FOTO to </= 40% limitation 10/18/16   Time 6   Period Weeks   Status New                Plan - 09/07/16 0929    Clinical Impression Statement Frances Taylor presents with resolution of lumbar pain and Rt LE pain. She presents with Rt cervical and UE pain primarily in the Rt shoulder. She has poor posture and alignment; limited cervical and Rt UE ROM; muscular tightness to palpation through Rt upper quarter; pain limiting functional activities. Frances Taylor will benefit from PT to address problems identified.    Clinical Presentation Evolving   Clinical Decision Making Low   Rehab Potential Good   PT Frequency 2x / week   PT Duration 6 weeks   PT Treatment/Interventions Patient/family education;ADLs/Self Care Home Management;Cryotherapy;Electrical Stimulation;Iontophoresis 4mg /ml Dexamethasone;Moist Heat;Traction;Ultrasound;Dry needling;Manual techniques;Therapeutic activities;Therapeutic exercise;Neuromuscular re-education   PT Next Visit Plan postural correction; manual work through Rt cervical and shoulder girdle musculature; modalities as indicated.    Consulted and Agree with Plan of Care Patient      Patient will benefit from skilled therapeutic intervention in order to improve the following deficits and impairments:  Postural dysfunction, Improper body mechanics, Pain, Increased muscle spasms, Increased fascial restricitons, Decreased range of motion, Decreased mobility, Decreased strength, Decreased activity tolerance  Visit Diagnosis: Cervicalgia - Plan: PT plan of care cert/re-cert  Acute pain of right shoulder - Plan: PT plan of care cert/re-cert  Other symptoms and signs involving the musculoskeletal system - Plan: PT plan of care cert/re-cert  Muscle weakness (generalized) - Plan: PT plan of care cert/re-cert     Problem  List Patient Active Problem List   Diagnosis Date Noted  . Radiculitis of right cervical region 08/30/2016  . Right lumbar radiculitis 08/02/2016  . Anterolisthesis 08/02/2016  . Vasomotor flushing 06/13/2016  . Postmenopausal atrophic vaginitis 04/27/2016  . Gastroesophageal reflux disease 04/25/2016  . BPPV (benign paroxysmal positional vertigo) 04/13/2016    Frances Taylor Nilda Simmer PT, MPH  09/07/2016, 9:41 AM  [facility] [place], [place], [address] Phone: [phone]   Fax:  [phone]  Name: Frances Taylor MRN: [id number] Date of Birth: [birth year]

## 2016-09-13 ENCOUNTER — Ambulatory Visit (INDEPENDENT_AMBULATORY_CARE_PROVIDER_SITE_OTHER): Payer: Managed Care, Other (non HMO) | Admitting: Physical Therapy

## 2016-09-13 DIAGNOSIS — R29898 Other symptoms and signs involving the musculoskeletal system: Secondary | ICD-10-CM

## 2016-09-13 DIAGNOSIS — M6281 Muscle weakness (generalized): Secondary | ICD-10-CM

## 2016-09-13 DIAGNOSIS — M542 Cervicalgia: Secondary | ICD-10-CM | POA: Diagnosis not present

## 2016-09-13 DIAGNOSIS — M25511 Pain in right shoulder: Secondary | ICD-10-CM | POA: Diagnosis not present

## 2016-09-13 NOTE — Therapy (Signed)
[place], [place], [address] Phone: [phone]   Fax:  [phone]  Physical Therapy Treatment  Patient Details  Name: Frances Taylor MRN: [id number] Date of Birth: 1960/04/24 Referring Provider: Dr. Dianah Field  Encounter Date: 09/13/2016      PT End of Session - 09/13/16 0929    Visit Number 5   Number of Visits 16   Date for PT Re-Evaluation 10/18/16   PT Start Time 8676  pt arrived late   PT Stop Time 0946   PT Time Calculation (min) 54 min   Activity Tolerance Patient limited by pain   Behavior During Therapy [facility] for tasks assessed/performed      Past Medical History:  Diagnosis Date  . Cervical high risk HPV (human papillomavirus) test positive   . GERD (gastroesophageal reflux disease)   . Headache   . Hypertension    took herself off medication, lifestyle    Past Surgical History:  Procedure Laterality Date  . IRRIGATION AND DEBRIDEMENT SEBACEOUS CYST     scalp    There were no vitals filed for this visit.      Subjective Assessment - 09/13/16 0931    Subjective Pt reports symptoms of her Rt shoulder are persistant.  "I wish I could cut my (Rt) arm off some days" due to pain.  It is limiting her sleep. She is currently on FMLA from work.    Pertinent History denies any musculoskeletal problems or medical problems; does have some pain and "problems" in the Rt side of her neck at times; vertigo    Currently in Pain? Yes   Pain Score 6    Pain Location Shoulder   Pain Orientation Right   Pain Descriptors / Indicators Nagging   Aggravating Factors  any activity.    Pain Relieving Factors medication, heat, icy hot             [facility] PT Assessment - 09/13/16 0001      Assessment   Medical Diagnosis Cervical radiculitis Rt shoulder pain; Rt sided LBP Rt sciatica   Referring Provider Dr. Dianah Field   Onset Date/Surgical Date 06/24/16   Hand Dominance Right   Next MD Visit 10/11/16     AROM   Right/Left Shoulder Right   Right Shoulder Flexion 134 Degrees  supine, with pain           [facility] Adult PT Treatment/Exercise - 09/13/16 0001      Shoulder Exercises: Supine   Flexion AAROM;Both;10 reps  cane, range to tolerance    Flexion Limitations the press up with cane (flex to 90 deg) x 10     Shoulder Exercises: Seated   Other Seated Exercises scap retraction x 5 sec x 10 reps    Other Seated Exercises chin tucks x 3 sec hold x 10 reps      Shoulder Exercises: Standing   Internal Rotation AAROM;Both;10 reps  cane held horizontal, moving up over buttocks   Extension AAROM;Both;5 reps  with cane     Shoulder Exercises: ROM/Strengthening   UBE (Upper Arm Bike) L1: 2 min forward (backward increased pain)     Shoulder Exercises: Stretch   Other Shoulder Stretches low and midlevel doorway stretch x 2  reps each position x 15 sec    Other Shoulder Stretches supine RUE nerve glide x 10 reps, 3 reps of Rt hand behind head + elbow press.       Moist Heat Therapy   Number  Minutes Moist Heat 20 Minutes   Moist Heat Location Cervical  thoracic     Electrical Stimulation   Electrical Stimulation Location Rt cervical paraspinals, levator, lateral Rt shoulder, Rt mid thoracic paraspinals   Electrical Stimulation Action IFC   Electrical Stimulation Parameters to tolerance    Electrical Stimulation Goals Tone;Pain     Manual Therapy   Manual Therapy Soft tissue mobilization   Soft tissue mobilization STM to Rt thoracic parapsinals, upper trap, rhomboid, levator.    Myofascial Release Rt thoracolumbar paraspinals                      PT Long Term Goals - 09/13/16 1320      PT LONG TERM GOAL #1   Title Improve posture and alignment with patient to demonstrate upright posture and alignment with posterior shoudler girdle engaged 10/18/16   Time 6   Period Weeks   Status On-going     PT LONG TERM GOAL #2   Title Increase  cervical and Rt UE mobilty and ROM to WFL's throughout 10/18/16   Time 6   Period Weeks   Status On-going     PT LONG TERM GOAL #3   Title Decrease pain in Rt shoulder with functional activities to 0/10 to 2/10  by 10/18/16   Time 6   Period Weeks   Status On-going     PT LONG TERM GOAL #4   Title Independent in HEP 10/18/16/18   Time 6   Period Weeks   Status On-going     PT LONG TERM GOAL #5   Title Improve FOTO to </= 40% limitation 10/18/16   Time 6   Period Weeks   Status On-going               Plan - 09/13/16 1318    Clinical Impression Statement Pt had limited tolerance for exercise, as everything slightly increase her RUE pain.  Muscular tightness palpable in Rt upper quarter, not point tender through rotator cuff.  Pt reported reduction of pain at end of session by 2 points.  Pt making gradual gains towards goals.    Rehab Potential Good   PT Frequency 2x / week   PT Duration 6 weeks   PT Treatment/Interventions Patient/family education;ADLs/Self Care Home Management;Cryotherapy;Electrical Stimulation;Iontophoresis 4mg /ml Dexamethasone;Moist Heat;Traction;Ultrasound;Dry needling;Manual techniques;Therapeutic activities;Therapeutic exercise;Neuromuscular re-education   PT Next Visit Plan postural correction; manual work through Rt cervical and shoulder girdle musculature; modalities as indicated.    Consulted and Agree with Plan of Care Patient      Patient will benefit from skilled therapeutic intervention in order to improve the following deficits and impairments:  Postural dysfunction, Improper body mechanics, Pain, Increased muscle spasms, Increased fascial restricitons, Decreased range of motion, Decreased mobility, Decreased strength, Decreased activity tolerance  Visit Diagnosis: Cervicalgia  Acute pain of right shoulder  Other symptoms and signs involving the musculoskeletal system  Muscle weakness (generalized)     Problem List Patient Active  Problem List   Diagnosis Date Noted  . Radiculitis of right cervical region 08/30/2016  . Right lumbar radiculitis 08/02/2016  . Anterolisthesis 08/02/2016  . Vasomotor flushing 06/13/2016  . Postmenopausal atrophic vaginitis 04/27/2016  . Gastroesophageal reflux disease 04/25/2016  . BPPV (benign paroxysmal positional vertigo) 04/13/2016   Kerin Perna, PTA 09/13/16 1:23 PM  [facility] [place], [place], [address] Phone: [phone]   Fax:  [phone]  Name: Frances Taylor MRN: [id number] Date of  Birth: [birth year]

## 2016-09-15 ENCOUNTER — Encounter: Payer: Managed Care, Other (non HMO) | Admitting: Rehabilitative and Restorative Service Providers"

## 2016-09-20 ENCOUNTER — Ambulatory Visit (INDEPENDENT_AMBULATORY_CARE_PROVIDER_SITE_OTHER): Payer: Managed Care, Other (non HMO) | Admitting: Physical Therapy

## 2016-09-20 DIAGNOSIS — M542 Cervicalgia: Secondary | ICD-10-CM | POA: Diagnosis not present

## 2016-09-20 DIAGNOSIS — R29898 Other symptoms and signs involving the musculoskeletal system: Secondary | ICD-10-CM

## 2016-09-20 DIAGNOSIS — M25511 Pain in right shoulder: Secondary | ICD-10-CM | POA: Diagnosis not present

## 2016-09-20 DIAGNOSIS — M6281 Muscle weakness (generalized): Secondary | ICD-10-CM

## 2016-09-20 NOTE — Therapy (Addendum)
[place], [place], [address] Phone: [phone]   Fax:  [phone]  Physical Therapy Treatment  Patient Details  Name: Frances Taylor MRN: [id number] Date of Birth: [birth year] Referring Provider: Dr. Dianah Field  Encounter Date: 09/20/2016      PT End of Session - 09/20/16 0855    Visit Number 6   Number of Visits 16   Date for PT Re-Evaluation 10/18/16   PT Start Time 0848   PT Stop Time 0942   PT Time Calculation (min) 54 min   Activity Tolerance Patient limited by pain   Behavior During Therapy [facility] for tasks assessed/performed      Past Medical History:  Diagnosis Date  . Cervical high risk HPV (human papillomavirus) test positive   . GERD (gastroesophageal reflux disease)   . Headache   . Hypertension    took herself off medication, lifestyle    Past Surgical History:  Procedure Laterality Date  . IRRIGATION AND DEBRIDEMENT SEBACEOUS CYST     scalp    There were no vitals filed for this visit.      Subjective Assessment - 09/20/16 0856    Subjective Pt reports things are the unchanged since last visit. She reports the relief she had from last session last a little while. "I soon as I have to drive and look Rt, the symptoms come right back".  She complains of difficulty opening jars with Rt hand.    Currently in Pain? Yes   Pain Score 7    Pain Location Neck   Pain Orientation Right   Pain Descriptors / Indicators Nagging;Tingling   Pain Radiating Towards down Rt arm to fingers   Aggravating Factors  any activity   Pain Relieving Factors massage, heat, icy hot            [facility] PT Assessment - 09/20/16 0001      Assessment   Medical Diagnosis Cervical radiculitis Rt shoulder pain; Rt sided LBP Rt sciatica   Referring Provider Dr. Dianah Field   Onset Date/Surgical Date 06/24/16   Hand Dominance Right   Next MD Visit 10/11/16   Prior Therapy here for back x 3  visits      ROM / Strength   AROM / PROM / Strength Strength     AROM   Right Shoulder Flexion 134 Degrees  supine, with pain      Strength   Strength Assessment Site Hand   Right/Left hand Right;Left   Right Hand Grip (lbs) 41, 46   Left Hand Grip (lbs) 49, 41                     [facility] Adult PT Treatment/Exercise - 09/20/16 0001      Shoulder Exercises: Supine   Flexion AAROM;Both;5 reps with yellow band, then 10 reps with dowel., range to tolerance    Other Supine Exercises snow angel to tolerance on narrow high/low table with nerve glide at ~60 deg x 10 reps   Other Supine Exercises scap retraction x 3-5 sec x 10      Shoulder Exercises: ROM/Strengthening   UBE (Upper Arm Bike) L1: 2 min forward (backward increased pain)     Shoulder Exercises: Stretch   Other Shoulder Stretches low and midlevel doorway stretch x 2  reps each position x 20 sec, upper trap stretch x 30 sec x 2 reps, levator stretch x 2 reps.       Modalities  Modalities Electrical Stimulation;Moist Heat;Traction     Moist Heat Therapy   Number Minutes Moist Heat 15 Minutes   Moist Heat Location Cervical     Electrical Stimulation   Electrical Stimulation Location Rt cervical paraspinals/ Rt Shoulder   Electrical Stimulation Action IFC   Electrical Stimulation Parameters to tolerance    Electrical Stimulation Goals Tone;Pain     Traction   Type of Traction Cervical   Min (lbs) 5   Max (lbs) 10   Hold Time 60   Rest Time 20   Time 15          PT Long Term Goals - 09/13/16 1320      PT LONG TERM GOAL #1   Title Improve posture and alignment with patient to demonstrate upright posture and alignment with posterior shoudler girdle engaged 10/18/16   Time 6   Period Weeks   Status On-going     PT LONG TERM GOAL #2   Title Increase cervical and Rt UE mobilty and ROM to WFL's throughout 10/18/16   Time 6   Period Weeks   Status On-going     PT LONG TERM GOAL #3   Title  Decrease pain in Rt shoulder with functional activities to 0/10 to 2/10  by 10/18/16   Time 6   Period Weeks   Status On-going     PT LONG TERM GOAL #4   Title Independent in HEP 10/18/16/18   Time 6   Period Weeks   Status On-going     PT LONG TERM GOAL #5   Title Improve FOTO to </= 40% limitation 10/18/16   Time 6   Period Weeks   Status On-going               Plan - 09/20/16 1308    Clinical Impression Statement Pt demonstrated grip strength that was somewhat equal.  She was able to complete exercises with min increase in pain (although pain reported was already high).  She reported headache with traction, reported after completion of treatment.   Pt with limited progress towards goals this visit.     Rehab Potential Good   PT Frequency 2x / week   PT Duration 6 weeks   PT Treatment/Interventions Patient/family education;ADLs/Self Care Home Management;Cryotherapy;Electrical Stimulation;Iontophoresis 4mg /ml Dexamethasone;Moist Heat;Traction;Ultrasound;Dry needling;Manual techniques;Therapeutic activities;Therapeutic exercise;Neuromuscular re-education   PT Next Visit Plan trial of combo [place], manual work through [employer] cervical/shoulder girdle.     Consulted and Agree with Plan of Care Patient      Patient will benefit from skilled therapeutic intervention in order to improve the following deficits and impairments:  Postural dysfunction, Improper body mechanics, Pain, Increased muscle spasms, Increased fascial restricitons, Decreased range of motion, Decreased mobility, Decreased strength, Decreased activity tolerance  Visit Diagnosis: Cervicalgia  Acute pain of right shoulder  Other symptoms and signs involving the musculoskeletal system  Muscle weakness (generalized)     Problem List Patient Active Problem List   Diagnosis Date Noted  . Radiculitis of right cervical region 08/30/2016  . Right lumbar radiculitis 08/02/2016  . Anterolisthesis 08/02/2016  . Vasomotor  flushing 06/13/2016  . Postmenopausal atrophic vaginitis 04/27/2016  . Gastroesophageal reflux disease 04/25/2016  . BPPV (benign paroxysmal positional vertigo) 04/13/2016   Kerin Perna, PTA 09/20/16 1:14 PM  [facility] [place], [place], [address] Phone: [phone]   Fax:  [phone]  Name: Frances Taylor MRN: [id number] Date of Birth: 31-Jan-1960

## 2016-09-22 ENCOUNTER — Ambulatory Visit (INDEPENDENT_AMBULATORY_CARE_PROVIDER_SITE_OTHER): Payer: Managed Care, Other (non HMO) | Admitting: Physical Therapy

## 2016-09-22 DIAGNOSIS — M25511 Pain in right shoulder: Secondary | ICD-10-CM

## 2016-09-22 DIAGNOSIS — R29898 Other symptoms and signs involving the musculoskeletal system: Secondary | ICD-10-CM | POA: Diagnosis not present

## 2016-09-22 DIAGNOSIS — M542 Cervicalgia: Secondary | ICD-10-CM

## 2016-09-22 DIAGNOSIS — M6281 Muscle weakness (generalized): Secondary | ICD-10-CM

## 2016-09-22 NOTE — Therapy (Signed)
[place], [place], [address] Phone: [phone]   Fax:  [phone]  Physical Therapy Treatment  Patient Details  Name: Frances Taylor MRN: [id number] Date of Birth: 04/25/1960 Referring Provider: Dr. Dianah Field  Encounter Date: 09/22/2016      PT End of Session - 09/22/16 0935    Visit Number 7   Number of Visits 16   Date for PT Re-Evaluation 10/18/16   PT Start Time 0848   PT Stop Time 0930   PT Time Calculation (min) 42 min   Activity Tolerance Patient limited by pain   Behavior During Therapy [facility] for tasks assessed/performed      Past Medical History:  Diagnosis Date  . Cervical high risk HPV (human papillomavirus) test positive   . GERD (gastroesophageal reflux disease)   . Headache   . Hypertension    took herself off medication, lifestyle    Past Surgical History:  Procedure Laterality Date  . IRRIGATION AND DEBRIDEMENT SEBACEOUS CYST     scalp    There were no vitals filed for this visit.      Subjective Assessment - 09/22/16 0935    Subjective Frances Taylor reports she had a brief headache after last session (with traction), but it resolved a few hours later.  Otherwise, nothing new to report.     Patient Stated Goals get rid of shoulder and neck pain    Currently in Pain? Yes   Pain Score 7    Pain Location Neck   Pain Orientation Right   Pain Descriptors / Indicators Nagging;Tingling   Aggravating Factors  any activity   Pain Relieving Factors massage, TENS, icy hot            [facility] PT Assessment - 09/22/16 0001      Assessment   Medical Diagnosis Cervical radiculitis Rt shoulder pain; Rt sided LBP Rt sciatica   Referring Provider Dr. Dianah Field   Onset Date/Surgical Date 06/24/16   Hand Dominance Right   Next MD Visit 10/11/16     AROM   Right Shoulder Flexion 140 Degrees  seated   Cervical Flexion 52   Cervical Extension 42   Cervical - Right Side  Bend 34   Cervical - Left Side Bend 40   Cervical - Right Rotation 50  supine, after 3 rotations   Cervical - Left Rotation 63           [facility] Adult PT Treatment/Exercise - 09/22/16 0001      Neck Exercises: Supine   Cervical Rotation Right;Left;5 reps     Neck Exercises: Sidelying   Other Sidelying Exercise thoracic/neck rotation Rt (in Lt sidelying) x 8 reps (painful only 1st rep, then only stretch feeling)     Modalities   Modalities Electrical Stimulation;Ultrasound     Electrical Stimulation   Electrical Stimulation Location Rt upper trap, lower cspine/upper tspine paraspinals  pt in prone position   Electrical Stimulation Action combo [place]   Electrical Stimulation Parameters to tolerance   Electrical Stimulation Goals Tone;Pain     Ultrasound   Ultrasound Location see estim   Ultrasound Parameters 100%, 1.1w/cm2, 8 min    Ultrasound Goals Pain;Other (Comment)  tone     Manual Therapy   Soft tissue mobilization STM to Rt thoracic parapsinals, upper trap, rhomboid, levator.    Myofascial Release Rt thoracic paraspinals      Neck Exercises: Stretches   Upper Trapezius Stretch 20 seconds;4 reps   Lower Cervical/Upper Thoracic  Stretch 4 reps;20 seconds  hands supporting head, leaning back in chair                     PT Long Term Goals - 09/13/16 1320      PT LONG TERM GOAL #1   Title Improve posture and alignment with patient to demonstrate upright posture and alignment with posterior shoudler girdle engaged 10/18/16   Time 6   Period Weeks   Status On-going     PT LONG TERM GOAL #2   Title Increase cervical and Rt UE mobilty and ROM to WFL's throughout 10/18/16   Time 6   Period Weeks   Status On-going     PT LONG TERM GOAL #3   Title Decrease pain in Rt shoulder with functional activities to 0/10 to 2/10  by 10/18/16   Time 6   Period Weeks   Status On-going     PT LONG TERM GOAL #4   Title Independent in HEP 10/18/16/18   Time 6   Period  Weeks   Status On-going     PT LONG TERM GOAL #5   Title Improve FOTO to </= 40% limitation 10/18/16   Time 6   Period Weeks   Status On-going               Plan - 09/22/16 4854    Clinical Impression Statement Continued pain and radicular symptoms in pt's Rt neck and RUE. Measurements similar to last visit and eval. She has much guarding with manual and exercise.  Limited tolerance to ther ex, however pt reported slight decrease in pain with thoracic ext and rotation exercise (reduction by 1 point per pt report). Pt req freq cues to return head to neutral ( from laterally flexed Lt).  Pt making slow progress towards goals.    Rehab Potential Good   PT Frequency 2x / week   PT Duration 6 weeks   PT Treatment/Interventions Patient/family education;ADLs/Self Care Home Management;Cryotherapy;Electrical Stimulation;Iontophoresis 4mg /ml Dexamethasone;Moist Heat;Traction;Ultrasound;Dry needling;Manual techniques;Therapeutic activities;Therapeutic exercise;Neuromuscular re-education   PT Next Visit Plan Trial of DN.    Consulted and Agree with Plan of Care Patient      Patient will benefit from skilled therapeutic intervention in order to improve the following deficits and impairments:  Postural dysfunction, Improper body mechanics, Pain, Increased muscle spasms, Increased fascial restricitons, Decreased range of motion, Decreased mobility, Decreased strength, Decreased activity tolerance  Visit Diagnosis: Cervicalgia  Acute pain of right shoulder  Other symptoms and signs involving the musculoskeletal system  Muscle weakness (generalized)     Problem List Patient Active Problem List   Diagnosis Date Noted  . Radiculitis of right cervical region 08/30/2016  . Right lumbar radiculitis 08/02/2016  . Anterolisthesis 08/02/2016  . Vasomotor flushing 06/13/2016  . Postmenopausal atrophic vaginitis 04/27/2016  . Gastroesophageal reflux disease 04/25/2016  . BPPV (benign  paroxysmal positional vertigo) 04/13/2016   Kerin Perna, PTA 09/22/16 9:55 AM  [facility] [place], [place], [address] Phone: [phone]   Fax:  [phone]  Name: Frances Taylor MRN: [id number] Date of Birth: [birth year]

## 2016-09-22 NOTE — Patient Instructions (Signed)

## 2016-09-28 ENCOUNTER — Encounter: Payer: Managed Care, Other (non HMO) | Admitting: Rehabilitative and Restorative Service Providers"

## 2016-09-29 ENCOUNTER — Encounter: Payer: Managed Care, Other (non HMO) | Admitting: Rehabilitative and Restorative Service Providers"

## 2016-10-05 ENCOUNTER — Ambulatory Visit (INDEPENDENT_AMBULATORY_CARE_PROVIDER_SITE_OTHER): Payer: 59 | Admitting: Rehabilitative and Restorative Service Providers"

## 2016-10-05 ENCOUNTER — Encounter: Payer: Self-pay | Admitting: Rehabilitative and Restorative Service Providers"

## 2016-10-05 DIAGNOSIS — R29898 Other symptoms and signs involving the musculoskeletal system: Secondary | ICD-10-CM

## 2016-10-05 DIAGNOSIS — M25511 Pain in right shoulder: Secondary | ICD-10-CM

## 2016-10-05 DIAGNOSIS — M6281 Muscle weakness (generalized): Secondary | ICD-10-CM

## 2016-10-05 DIAGNOSIS — M542 Cervicalgia: Secondary | ICD-10-CM | POA: Diagnosis not present

## 2016-10-05 NOTE — Therapy (Addendum)
[place], [place], [address] Phone: [phone]   Fax:  [phone]  Physical Therapy Treatment  Patient Details  Name: Frances Taylor MRN: [id number] Date of Birth: 18-Jul-1960 Referring Provider: Dr Dianah Field  Encounter Date: 10/05/2016      PT End of Session - 10/05/16 0936    Visit Number 8   Number of Visits 16   Date for PT Re-Evaluation 10/18/16   PT Start Time 0932   PT Stop Time 1019   PT Time Calculation (min) 47 min   Activity Tolerance Patient tolerated treatment well      Past Medical History:  Diagnosis Date  . Cervical high risk HPV (human papillomavirus) test positive   . GERD (gastroesophageal reflux disease)   . Headache   . Hypertension    took herself off medication, lifestyle    Past Surgical History:  Procedure Laterality Date  . IRRIGATION AND DEBRIDEMENT SEBACEOUS CYST     scalp    There were no vitals filed for this visit.      Subjective Assessment - 10/05/16 1003    Subjective Continued pain and discomfort with intermittent tingling into UE's. Not sure she wants to do the DN. Will read about it and decide for appt tomorrow. Sees Dr T next week.    Currently in Pain? Yes   Pain Score 6    Pain Location Neck   Pain Orientation Right   Pain Descriptors / Indicators Nagging;Tingling   Pain Type Acute pain   Pain Radiating Towards into Rt arm/fingers   Pain Onset More than a month ago   Pain Frequency Constant   Aggravating Factors  activity   Pain Relieving Factors massage; TENS; icy hot             [facility] PT Assessment - 10/05/16 0001      Assessment   Medical Diagnosis Cervical radiculitis Rt shoulder pain; Rt sided LBP Rt sciatica   Referring Provider Dr Dianah Field   Onset Date/Surgical Date 06/24/16   Hand Dominance Right   Next MD Visit 10/11/16     AROM   Right Shoulder Extension 41 Degrees   Right Shoulder Flexion 126 Degrees   standing - discomfort    Right Shoulder ABduction 141 Degrees   Right Shoulder External Rotation 105 Degrees   Cervical Flexion 55   Cervical Extension 41   Cervical - Right Side Bend 42   Cervical - Left Side Bend 35   Cervical - Right Rotation 61  pain    Cervical - Left Rotation 62     Palpation   Palpation comment continued tightness Rt cervical paraspinals through thoracic paraspinals; Rt ant/lat/post cervical musculature                     [facility] Adult PT Treatment/Exercise - 10/05/16 0001      Shoulder Exercises: Standing   Extension Strengthening;Both;10 reps   Theraband Level (Shoulder Extension) Level 2 (Red)   Row Strengthening;Both;10 reps;Theraband   Theraband Level (Shoulder Row) Level 2 (Red)   Retraction Strengthening;Both;10 reps;Theraband   Theraband Level (Shoulder Retraction) Level 1 (Yellow)   Other Standing Exercises scap squeeze w/ noodle 10 sec x 10      Shoulder Exercises: Stretch   Other Shoulder Stretches low and midlevel doorway stretch x 3  reps each position x 30 sec    Other Shoulder Stretches supine RUE nerve glide x 10 reps, 3 reps of Rt hand  behind head + elbow press.       Modalities   Modalities Electrical Stimulation;Ultrasound     Moist Heat Therapy   Number Minutes Moist Heat 20 Minutes   Moist Heat Location Cervical     Electrical Stimulation   Electrical Stimulation Location Rt upper trap, lower cspine/upper tspine paraspinals  pt in prone position   Electrical Stimulation Action IFC   Electrical Stimulation Parameters to tolerance   Electrical Stimulation Goals Tone;Pain     Neck Exercises: Stretches   Upper Trapezius Stretch 20 seconds;3 reps   Other Neck Stretches axial extension 10 x 5    Other Neck Stretches axial extension 10 sec x 5                 PT Education - 10/05/16 0945    Education provided Yes   Education Details DN HEP   Person(s) Educated Patient   Methods  Explanation;Demonstration;Tactile cues;Verbal cues;Handout   Comprehension Verbalized understanding;Returned demonstration;Verbal cues required;Tactile cues required             PT Long Term Goals - 10/05/16 1010      PT LONG TERM GOAL #1   Title Improve posture and alignment with patient to demonstrate upright posture and alignment with posterior shoudler girdle engaged 10/18/16   Time 6   Period Weeks   Status On-going     PT LONG TERM GOAL #2   Title Increase cervical and Rt UE mobilty and ROM to WFL's throughout 10/18/16   Time 6   Period Weeks   Status Partially Met     PT LONG TERM GOAL #3   Title Decrease pain in Rt shoulder with functional activities to 0/10 to 2/10  by 10/18/16   Time 6   Period Weeks   Status On-going     PT LONG TERM GOAL #4   Title Independent in HEP 10/18/16/18   Time 6   Period Weeks   Status On-going     PT LONG TERM GOAL #5   Title Improve FOTO to </= 40% limitation 10/18/16   Time 6   Period Weeks   Status On-going               Plan - 10/05/16 1007    Clinical Impression Statement Cotninued cervical pain with radicular symptoms into the Rt UE. She is unsure she wants to try DN. Limited tolerance for exercise. She continues to have poor posture and alignment through the cervical spine; limited cervical and Rt shoulder ROM; musular tightness to palpation; pain and limited functional activity level.    Rehab Potential Good   PT Frequency 2x / week   PT Duration 6 weeks   PT Treatment/Interventions Patient/family education;ADLs/Self Care Home Management;Cryotherapy;Electrical Stimulation;Iontophoresis 56m/ml Dexamethasone;Moist Heat;Traction;Ultrasound;Dry needling;Manual techniques;Therapeutic activities;Therapeutic exercise;Neuromuscular re-education   PT Next Visit Plan Trial of DN if patient decides to proceed. continue with rehab. note to MD    Consulted and Agree with Plan of Care Patient      Patient will benefit from  skilled therapeutic intervention in order to improve the following deficits and impairments:     Visit Diagnosis: Cervicalgia  Acute pain of right shoulder  Other symptoms and signs involving the musculoskeletal system  Muscle weakness (generalized)     Problem List Patient Active Problem List   Diagnosis Date Noted  . Radiculitis of right cervical region 08/30/2016  . Right lumbar radiculitis 08/02/2016  . Anterolisthesis 08/02/2016  . Vasomotor flushing 06/13/2016  . Postmenopausal  atrophic vaginitis 04/27/2016  . Gastroesophageal reflux disease 04/25/2016  . BPPV (benign paroxysmal positional vertigo) 04/13/2016    Celyn Nilda Simmer PT, MPH  10/05/2016, 10:11 AM  [facility] [place], [place], [address] Phone: [phone]   Fax:  [phone]  Name: Jasper Ruminski MRN: [id number] Date of Birth: March 31, 1960  PHYSICAL THERAPY DISCHARGE SUMMARY  Visits from Start of Care: 8  Current functional level related to goals / functional outcomes: See last progress note for discharge status.   Remaining deficits: Continued pain as reported in last note.   Education / Equipment: HEP Plan: Patient agrees to discharge.  Patient goals were not met. Patient is being discharged due to not returning since the last visit.  ?????     Celyn P. Helene Kelp PT, MPH 11/21/16 10:12 AM

## 2016-10-05 NOTE — Patient Instructions (Addendum)
Resisted External Rotation: in Neutral - Bilateral   PALMS UP Sit or stand, tubing in both hands, elbows at sides, bent to 90, forearms forward. Pinch shoulder blades together and rotate forearms out. Keep elbows at sides. Repeat __10__ times per set. Do _2-3___ sets per session. Do _2-3___ sessions per day.   Low Row: Standing   Face anchor, feet shoulder width apart. Palms up, pull arms back, squeezing shoulder blades together. Repeat 10__ times per set. Do 2-3__ sets per session. Do 2-3__ sessions per week. Anchor Height: Waist     Strengthening: Resisted Extension   Hold tubing in right hand, arm forward. Pull arm back, elbow straight. Repeat _10___ times per set. Do 2-3____ sets per session. Do 2-3____ sessions per day.  shoulder      Trigger Point Dry Needling  . What is Trigger Point Dry Needling (DN)? o DN is a physical therapy technique used to treat muscle pain and dysfunction. Specifically, DN helps deactivate muscle trigger points (muscle knots).  o A thin filiform needle is used to penetrate the skin and stimulate the underlying trigger point. The goal is for a local twitch response (LTR) to occur and for the trigger point to relax. No medication of any kind is injected during the procedure.   . What Does Trigger Point Dry Needling Feel Like?  o The procedure feels different for each individual patient. Some patients report that they do not actually feel the needle enter the skin and overall the process is not painful. Very mild bleeding may occur. However, many patients feel a deep cramping in the muscle in which the needle was inserted. This is the local twitch response.   Marland Kitchen How Will I feel after the treatment? o Soreness is normal, and the onset of soreness may not occur for a few hours. Typically this soreness does not last longer than two days.  o Bruising is uncommon, however; ice can be used to decrease any possible bruising.  o In rare cases feeling  tired or nauseous after the treatment is normal. In addition, your symptoms may get worse before they get better, this period will typically not last longer than 24 hours.   . What Can I do After My Treatment? o Increase your hydration by drinking more water for the next 24 hours. o You may place ice or heat on the areas treated that have become sore, however, do not use heat on inflamed or bruised areas. Heat often brings more relief post needling. o You can continue your regular activities, but vigorous activity is not recommended initially after the treatment for 24 hours. o DN is best combined with other physical therapy such as strengthening, stretching, and other therapies.

## 2016-10-06 ENCOUNTER — Encounter: Payer: 59 | Admitting: Rehabilitative and Restorative Service Providers"

## 2016-10-11 ENCOUNTER — Encounter: Payer: Self-pay | Admitting: Sports Medicine

## 2016-10-11 ENCOUNTER — Ambulatory Visit (INDEPENDENT_AMBULATORY_CARE_PROVIDER_SITE_OTHER): Payer: 59 | Admitting: Sports Medicine

## 2016-10-11 DIAGNOSIS — M5412 Radiculopathy, cervical region: Secondary | ICD-10-CM | POA: Diagnosis not present

## 2016-10-11 DIAGNOSIS — M5416 Radiculopathy, lumbar region: Secondary | ICD-10-CM | POA: Diagnosis not present

## 2016-10-11 MED ORDER — GABAPENTIN 300 MG PO CAPS: ORAL_CAPSULE | ORAL | 3 refills | Status: DC

## 2016-10-11 NOTE — Assessment & Plan Note (Signed)
Severe right-sided multilevel protruding discs back in 2015. Only moderate improvement after 6 weeks of formal PT. Adding an MRI for interventional planning. Ultimately I am starting gabapentin as well so she gets good relief we will not need to proceed to an epidural.

## 2016-10-11 NOTE — Addendum Note (Signed)
Addended by: Silverio Decamp on: 10/11/2016 11:40 AM   Modules accepted: Level of Service

## 2016-10-11 NOTE — Progress Notes (Addendum)
  Subjective:    CC: Follow-up  HPI: Right cervical radiculitis: Improved but not significantly so after 6 weeks of formal physical therapy, NSAIDs, steroids.  Right L4 radiculitis: Improved, but not significant only so after 6 weeks of formal physical therapy, NSAIDs, steroids.  Vertigo: Slight increase, I have advised that she is to follow-up with her primary medical provider for this.  Past medical history:  Negative.  See flowsheet/record as well for more information.  Surgical history: Negative.  See flowsheet/record as well for more information.  Family history: Negative.  See flowsheet/record as well for more information.  Social history: Negative.  See flowsheet/record as well for more information.  Allergies, and medications have been entered into the medical record, reviewed, and no changes needed.   Review of Systems: No fevers, chills, night sweats, weight loss, chest pain, or shortness of breath.   Objective:    General: Well Developed, well nourished, and in no acute distress.  Neuro: Alert and oriented x3, extra-ocular muscles intact, sensation grossly intact.  HEENT: Normocephalic, atraumatic, pupils equal round reactive to light, neck supple, no masses, no lymphadenopathy, thyroid nonpalpable.  Skin: Warm and dry, no rashes. Cardiac: Regular rate and rhythm, no murmurs rubs or gallops, no lower extremity edema.  Respiratory: Clear to auscultation bilaterally. Not using accessory muscles, speaking in full sentences.  Impression and Recommendations:    Radiculitis of right cervical region Severe right-sided multilevel protruding discs back in 2015. Only moderate improvement after 6 weeks of formal PT. Adding an MRI for interventional planning. Ultimately I am starting gabapentin as well so she gets good relief we will not need to proceed to an epidural.  Right lumbar radiculitis Same as above, ordering an MRI, having clinical right L4 distribution  radiculitis. Failed greater than 6 weeks of physical therapy. Also adding gabapentin  I spent 40 minutes with this patient, greater than 50% was face-to-face time counseling regarding the above diagnoses ___________________________________________ Gwen Her. Dianah Field, M.D., ABFM., CAQSM. Primary Care and [place] Instructor of [place] of [facility] of Medicine

## 2016-10-11 NOTE — Assessment & Plan Note (Signed)
Same as above, ordering an MRI, having clinical right L4 distribution radiculitis. Failed greater than 6 weeks of physical therapy. Also adding gabapentin

## 2016-10-31 ENCOUNTER — Ambulatory Visit (INDEPENDENT_AMBULATORY_CARE_PROVIDER_SITE_OTHER): Payer: 59

## 2016-10-31 DIAGNOSIS — M5412 Radiculopathy, cervical region: Secondary | ICD-10-CM

## 2016-10-31 DIAGNOSIS — M5116 Intervertebral disc disorders with radiculopathy, lumbar region: Secondary | ICD-10-CM

## 2016-10-31 DIAGNOSIS — M4802 Spinal stenosis, cervical region: Secondary | ICD-10-CM | POA: Diagnosis not present

## 2016-10-31 DIAGNOSIS — M4722 Other spondylosis with radiculopathy, cervical region: Secondary | ICD-10-CM | POA: Diagnosis not present

## 2016-10-31 DIAGNOSIS — M5416 Radiculopathy, lumbar region: Secondary | ICD-10-CM

## 2016-11-09 ENCOUNTER — Ambulatory Visit (INDEPENDENT_AMBULATORY_CARE_PROVIDER_SITE_OTHER): Payer: 59 | Admitting: Sports Medicine

## 2016-11-09 ENCOUNTER — Encounter: Payer: Self-pay | Admitting: Sports Medicine

## 2016-11-09 DIAGNOSIS — M5412 Radiculopathy, cervical region: Secondary | ICD-10-CM

## 2016-11-09 DIAGNOSIS — M5416 Radiculopathy, lumbar region: Secondary | ICD-10-CM

## 2016-11-09 NOTE — Assessment & Plan Note (Signed)
Right L4 radiculitis, patient is going to think about it before he proceed with epidural, she's failed conservative measures. We spent a great deal of time discussing the anatomy and evolutionary anthropology of degenerative disc disease.

## 2016-11-09 NOTE — Progress Notes (Signed)
  Subjective:    CC: Follow-up MRI  HPI: This is a pleasant 56 year old female, here for follow-up of right cervical radiculitis and lumbar radiculitis, MRI results will be dictated below. Continues to have symptoms, she does have a case open with [employer]er's [employer]. And an attorney.  Past medical history:  Negative.  See flowsheet/record as well for more information.  Surgical history: Negative.  See flowsheet/record as well for more information.  Family history: Negative.  See flowsheet/record as well for more information.  Social history: Negative.  See flowsheet/record as well for more information.  Allergies, and medications have been entered into the medical record, reviewed, and no changes needed.   Review of Systems: No fevers, chills, night sweats, weight loss, chest pain, or shortness of breath.   Objective:    General: Well Developed, well nourished, and in no acute distress.  Neuro: Alert and oriented x3, extra-ocular muscles intact, sensation grossly intact.  HEENT: Normocephalic, atraumatic, pupils equal round reactive to light, neck supple, no masses, no lymphadenopathy, thyroid nonpalpable.  Skin: Warm and dry, no rashes. Cardiac: Regular rate and rhythm, no murmurs rubs or gallops, no lower extremity edema.  Respiratory: Clear to auscultation bilaterally. Not using accessory muscles, speaking in full sentences.  Cervical spine MRI shows retrolisthesis as well as multiple levels of disc protrusion with central canal stenosis.  Lumbar spine MRI shows L4-L5 and L5-S1 disc protrusion, broad-based without severe foraminal stenosis.  Impression and Recommendations:    Right lumbar radiculitis Right L4 radiculitis, patient is going to think about it before he proceed with epidural, she's failed conservative measures. We spent a great deal of time discussing the anatomy and evolutionary anthropology of degenerative disc disease.  Radiculitis of right cervical region Multilevel  cervical degenerative disc disease, patient is going to think about it before he proceed with epidural, she's failed conservative measures. We spent a great deal of time discussing the anatomy and evolutionary anthropology of degenerative disc disease.  I spent 40 minutes with this patient, greater than 50% was face-to-face time counseling regarding the above diagnoses ___________________________________________ Gwen Her. Dianah Field, M.D., ABFM., CAQSM. Primary Care and [place] Instructor of [place] of [facility] of Medicine

## 2016-11-09 NOTE — Assessment & Plan Note (Signed)
Multilevel cervical degenerative disc disease, patient is going to think about it before he proceed with epidural, she's failed conservative measures. We spent a great deal of time discussing the anatomy and evolutionary anthropology of degenerative disc disease.

## 2017-01-09 ENCOUNTER — Other Ambulatory Visit: Payer: Self-pay | Admitting: Obstetrics and Gynecology

## 2017-01-09 DIAGNOSIS — R102 Pelvic and perineal pain: Secondary | ICD-10-CM

## 2017-01-10 ENCOUNTER — Encounter: Payer: 59 | Admitting: Obstetrics and Gynecology

## 2017-01-19 ENCOUNTER — Ambulatory Visit (INDEPENDENT_AMBULATORY_CARE_PROVIDER_SITE_OTHER): Payer: 59

## 2017-01-19 DIAGNOSIS — R102 Pelvic and perineal pain: Secondary | ICD-10-CM

## 2017-01-19 DIAGNOSIS — N83292 Other ovarian cyst, left side: Secondary | ICD-10-CM | POA: Diagnosis not present

## 2017-01-25 ENCOUNTER — Telehealth: Payer: Self-pay | Admitting: Physician Assistant

## 2017-01-25 NOTE — Telephone Encounter (Signed)
Needs a letter stating she is okay to return to work on Jan 7th and state she cant be bending, high reaching and squatting, and weight limits. Please call pt when this is ready for pick up. Patient seen Dr,T back pain

## 2017-01-25 NOTE — Telephone Encounter (Signed)
Routing to covering Provider for review.

## 2017-01-27 NOTE — Telephone Encounter (Signed)
Spoke to patient regarding back pain/restrictions. 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 Needs a letter stating resections. I reviewed most recent progress note. No specifics were mentioned by Dr. Darene Lamer but given review of her symptoms and diagnoses, request seems reasonable. Letter written and left at front for patient to pick up, she states she will get later today or Monday

## 2017-02-01 ENCOUNTER — Ambulatory Visit (INDEPENDENT_AMBULATORY_CARE_PROVIDER_SITE_OTHER): Payer: 59 | Admitting: Obstetrics and Gynecology

## 2017-02-01 ENCOUNTER — Encounter: Payer: Self-pay | Admitting: Obstetrics and Gynecology

## 2017-02-01 VITALS — BP 123/79 | HR 78 | Resp 16 | Ht 65.0 in | Wt 168.0 lb

## 2017-02-01 DIAGNOSIS — Z712 Person consulting for explanation of examination or test findings: Secondary | ICD-10-CM

## 2017-02-01 DIAGNOSIS — N83202 Unspecified ovarian cyst, left side: Secondary | ICD-10-CM

## 2017-02-01 DIAGNOSIS — Z1239 Encounter for other screening for malignant neoplasm of breast: Secondary | ICD-10-CM

## 2017-02-01 NOTE — Progress Notes (Signed)
57 yo G2P2 with BMI 27 here to discuss results of recent pelvic ultrasound. Patient reports feeling well without complaints. She reports some occasional back strain. Patient with incidental CT finding of an ovarian cyst   12/2016 Ultrasound  FINDINGS: Uterus  Measurements: 6.8 x 3.6 x 4.4 cm. No fibroids or other mass visualized.  Endometrium  Thickness: 4 mm.  No focal abnormality visualized.  Right ovary  Measurements: 2.0 x 1.4 x 1.5 cm. Normal appearance/no adnexal mass.  Left ovary  Measurements: Simple appearing cyst measures 3.0 x 2.0 x 2.4 cm. No complex features seen.  Other findings  No abnormal free fluid.  IMPRESSION: 3 cm simple appearing left adnexal cyst with benign characteristics. Recommend continued followup by transvaginal pelvic ultrasound in 1 year. This recommendation follows the consensus statement: Management of Asymptomatic Ovarian and Other Adnexal Cysts Imaged at [place]: Society of Radiologists in [place]. Radiology 2010; [phone].   Electronically Signed   By: Earle Gell M.D.   On: 01/19/2017 11:16  A/P 57 yo with a benign appearing left ovarian cyst - Results reviewed with the patient - Plan for repeat ultrasound in 6-12 months - Screening mammogram ordered - RTC prn

## 2018-03-29 ENCOUNTER — Telehealth: Payer: Self-pay | Admitting: *Deleted

## 2018-03-29 NOTE — Telephone Encounter (Signed)
Left patient an urgent message to call and reschedule appointment for a later time after 2:00pm on 04/19/2018 due to Dr. Efrain Sella having a meeting from 1-2:00pm and not arriving until 2:15-2:20pm per Dr. Elly Modena. Patient requested Dr. Elly Modena during scheduling.

## 2018-04-11 ENCOUNTER — Telehealth: Payer: Self-pay | Admitting: *Deleted

## 2018-04-11 NOTE — Telephone Encounter (Signed)
LM on voicemail that due to COVID-19 her appt is being cancelled and she will be contacted to reschedule the appt once we get the all clear.

## 2018-04-19 ENCOUNTER — Ambulatory Visit: Payer: 59 | Admitting: Obstetrics and Gynecology

## 2018-04-25 IMAGING — DX DG CERVICAL SPINE COMPLETE 4+V
5 series · 5 of 5 positions shown · non-contrast
Comparison: None.

CLINICAL DATA: 56-year-old female with spine pain radiating to the
right arm and right leg intermittently status post lifting injury in
early [REDACTED]. Radiculitis.

EXAM:
CERVICAL SPINE - COMPLETE 4+ VIEW

[c-spine lat]
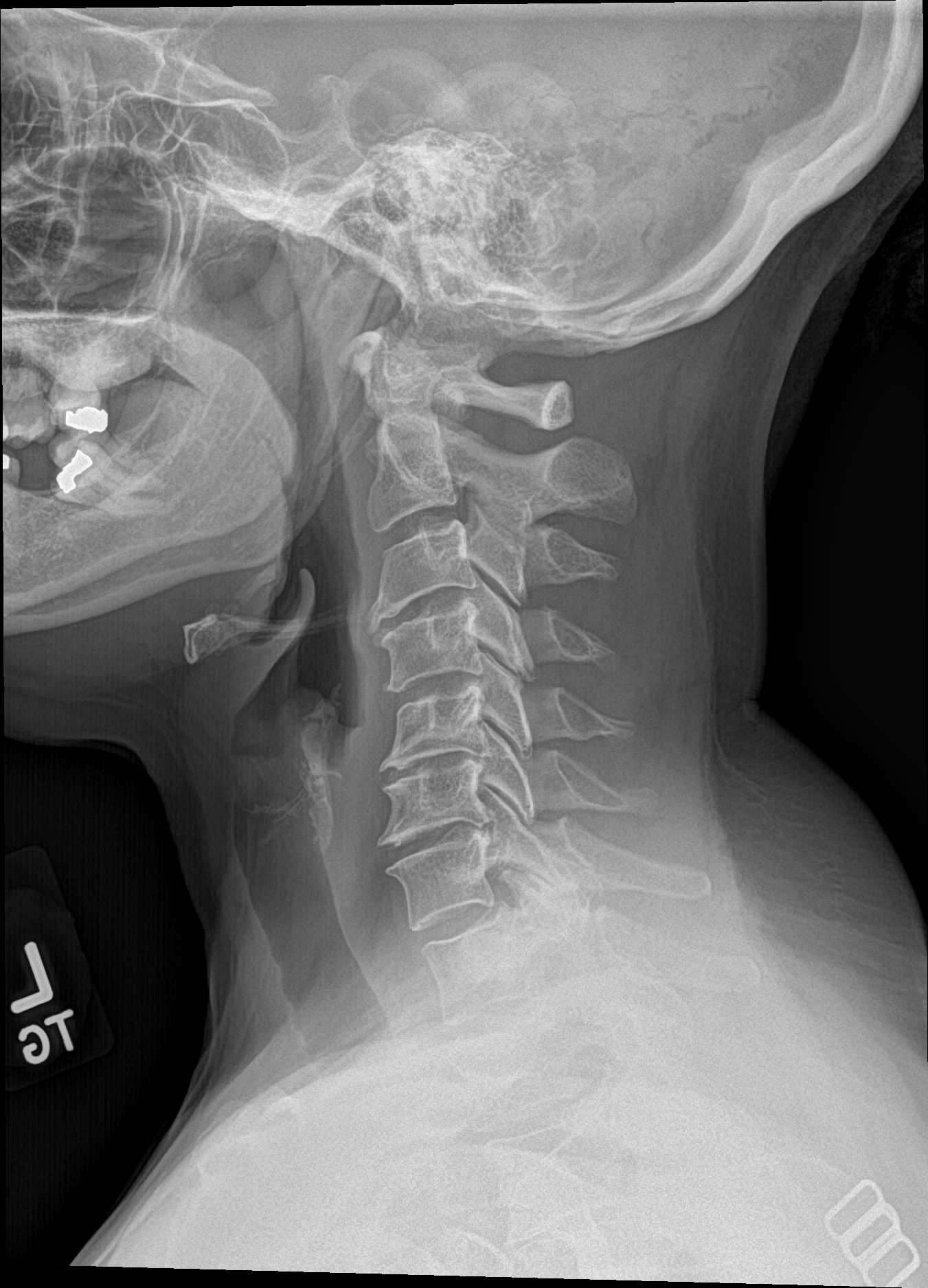

[c-spine obl (1 of 2)]
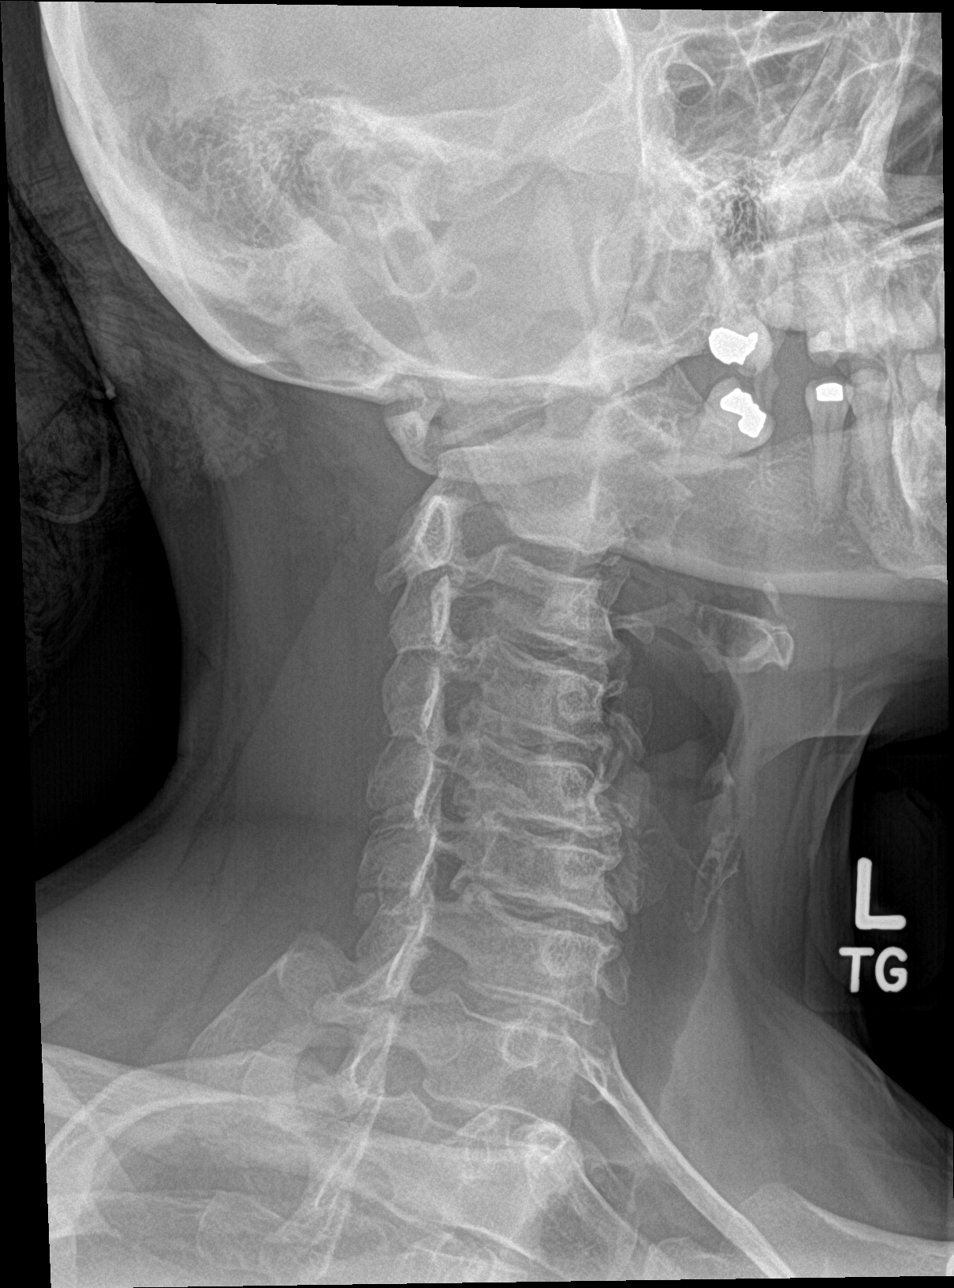

[c-spine obl (2 of 2)]
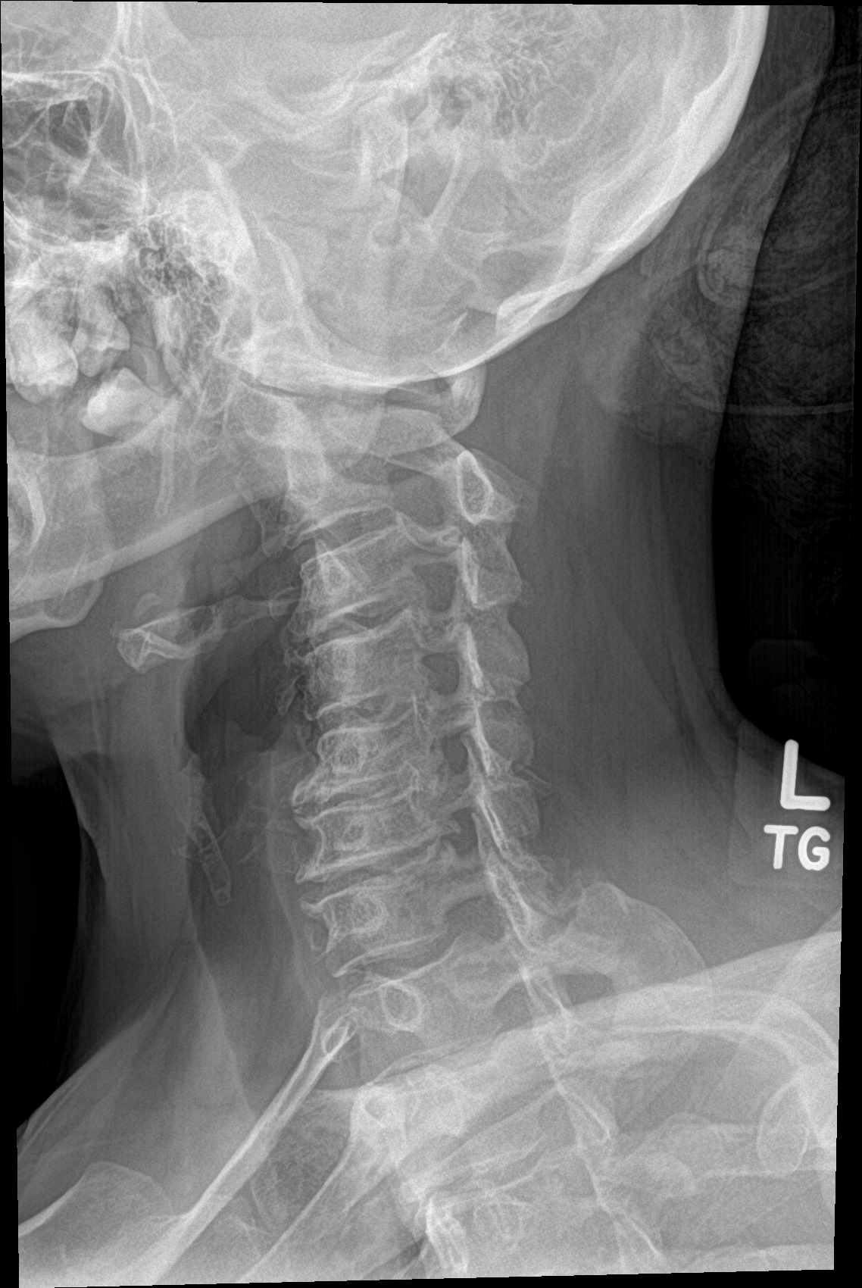

[c-spine ap]
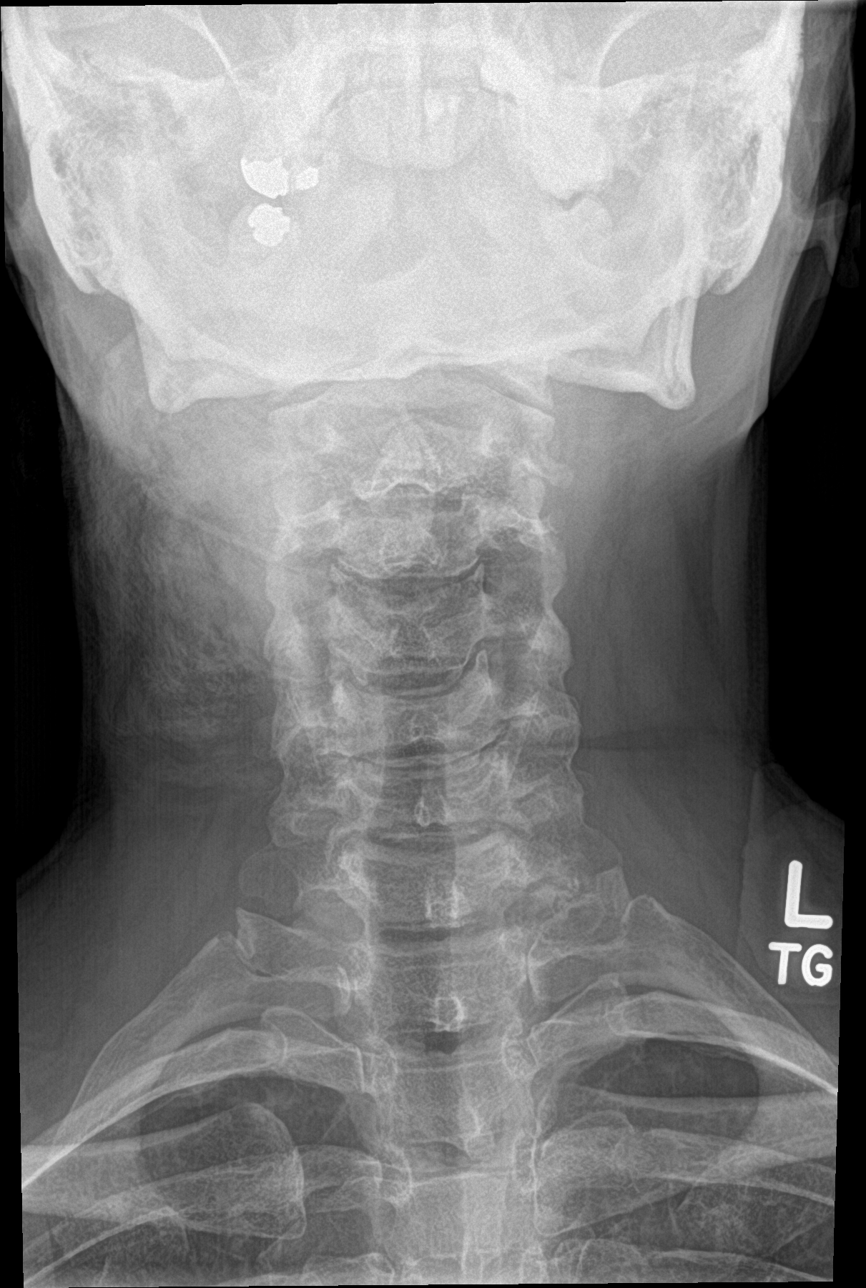

[c-spine open mouth]
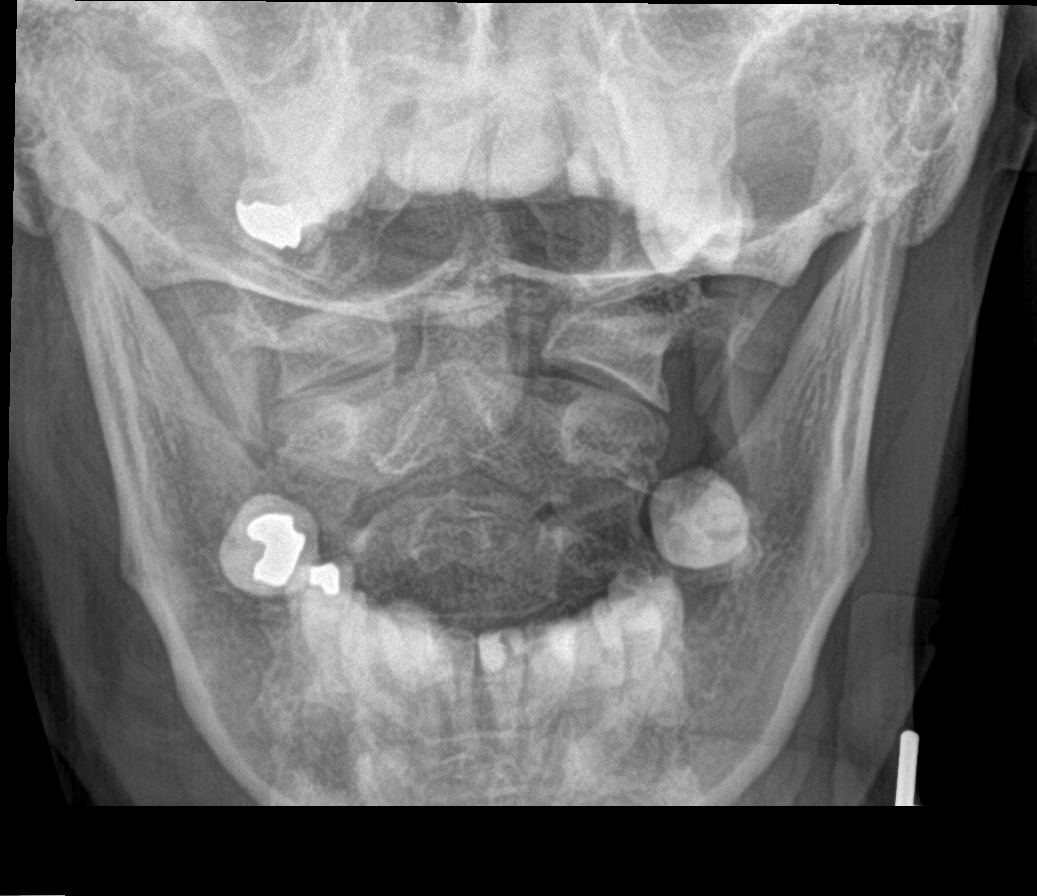

[5 of 5 positions shown; findings below may reference images not displayed]

FINDINGS: Normal prevertebral soft tissue contour. Mild reversal of cervical
lordosis. Disc space loss with endplate spurring at C5-C6, and to a
lesser extent C6-C7 and C3-C4. Bilateral posterior element alignment
is within normal limits. Trace degenerative appearing
anterolisthesis at C7-T1 associated with mild to moderate facet
hypertrophy there. Normal AP alignment. Normal C1-C2 alignment.
Negative lung apices.
IMPRESSION: Fairly advanced chronic disc and endplate degeneration at C5-C6.
Lesser endplate and disc degeneration at C3-C4 and C6-C7. Chronic
facet degeneration at C7-T1.

## 2018-04-25 IMAGING — DX DG LUMBAR SPINE COMPLETE 4+V
5 series · 5 of 5 positions shown · non-contrast
Comparison: None.

CLINICAL DATA: 56-year-old female with spine pain radiating to the
right arm and right leg intermittently status post lifting injury in
early [REDACTED]. Radiculitis.

EXAM:
LUMBAR SPINE - COMPLETE 4+ VIEW

[l-spine ap]
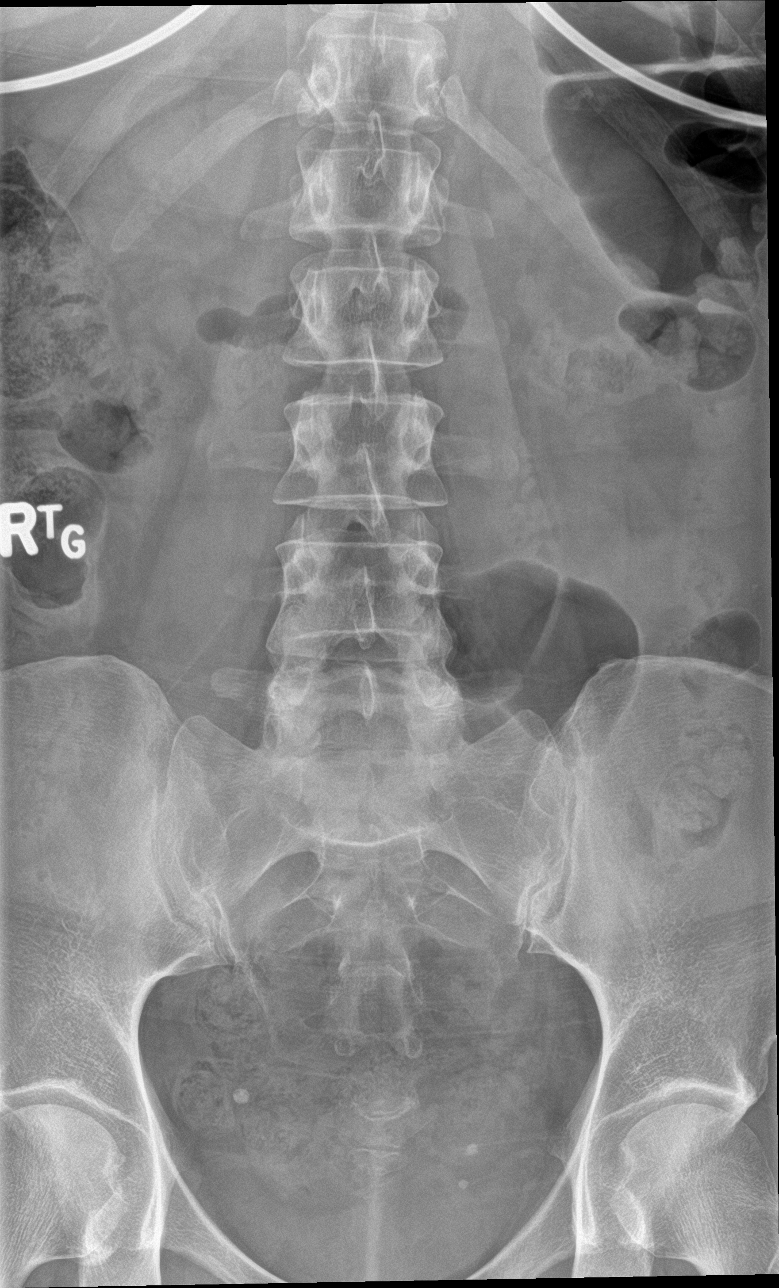

[l-spine obl (1 of 2)]
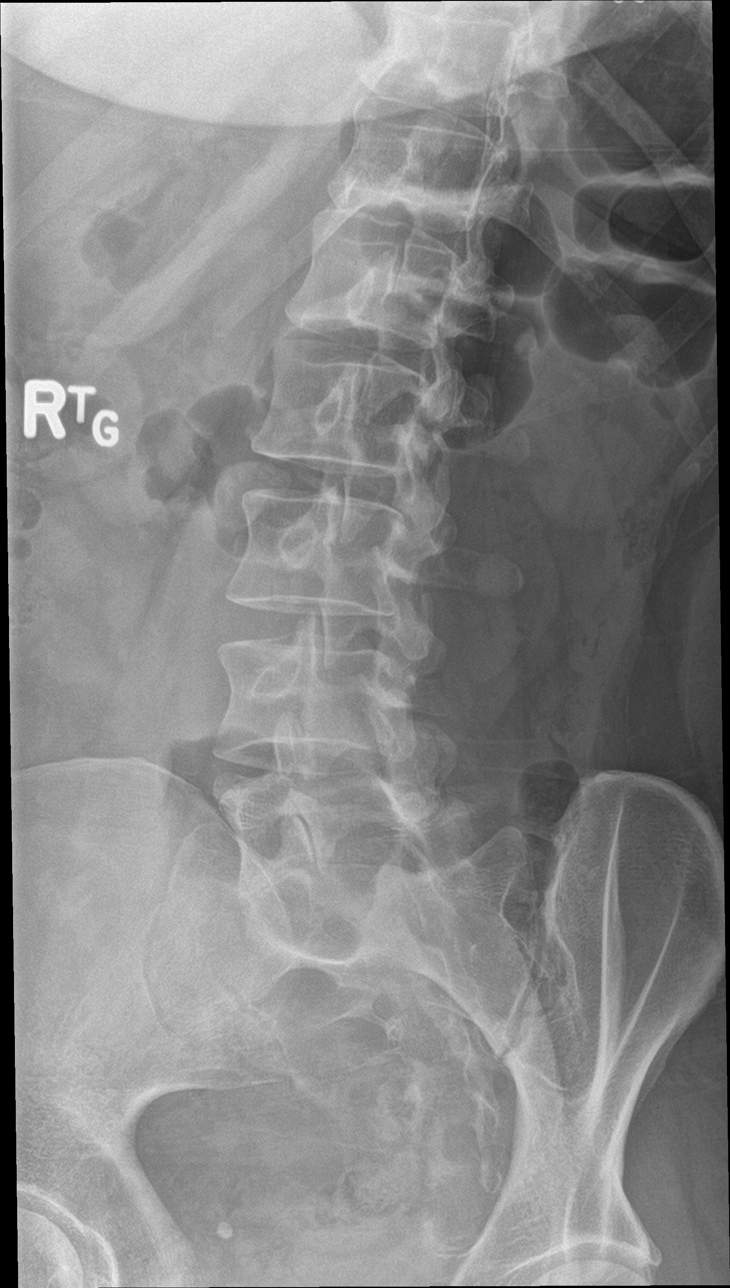

[l-spine obl (2 of 2)]
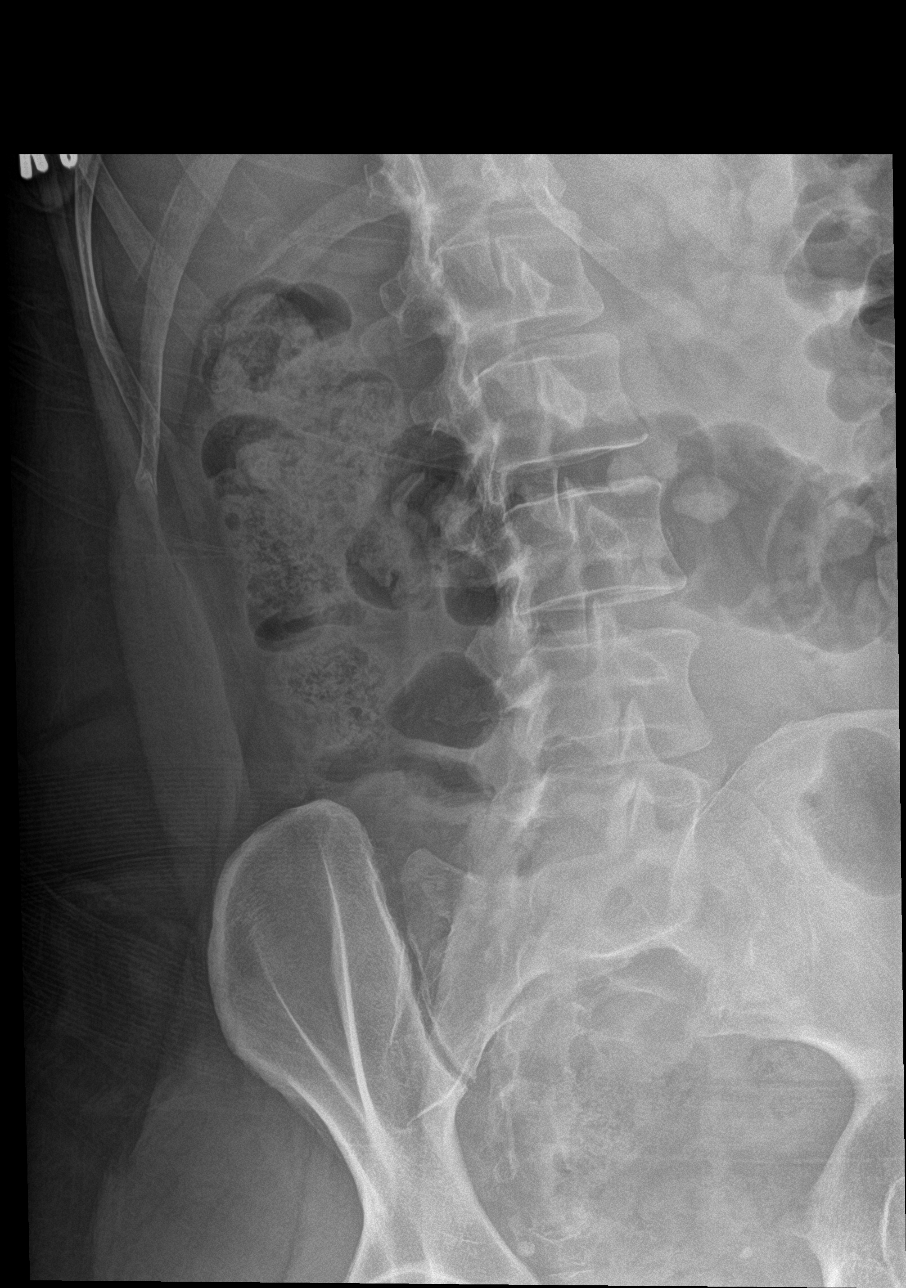

[l-spine lat]
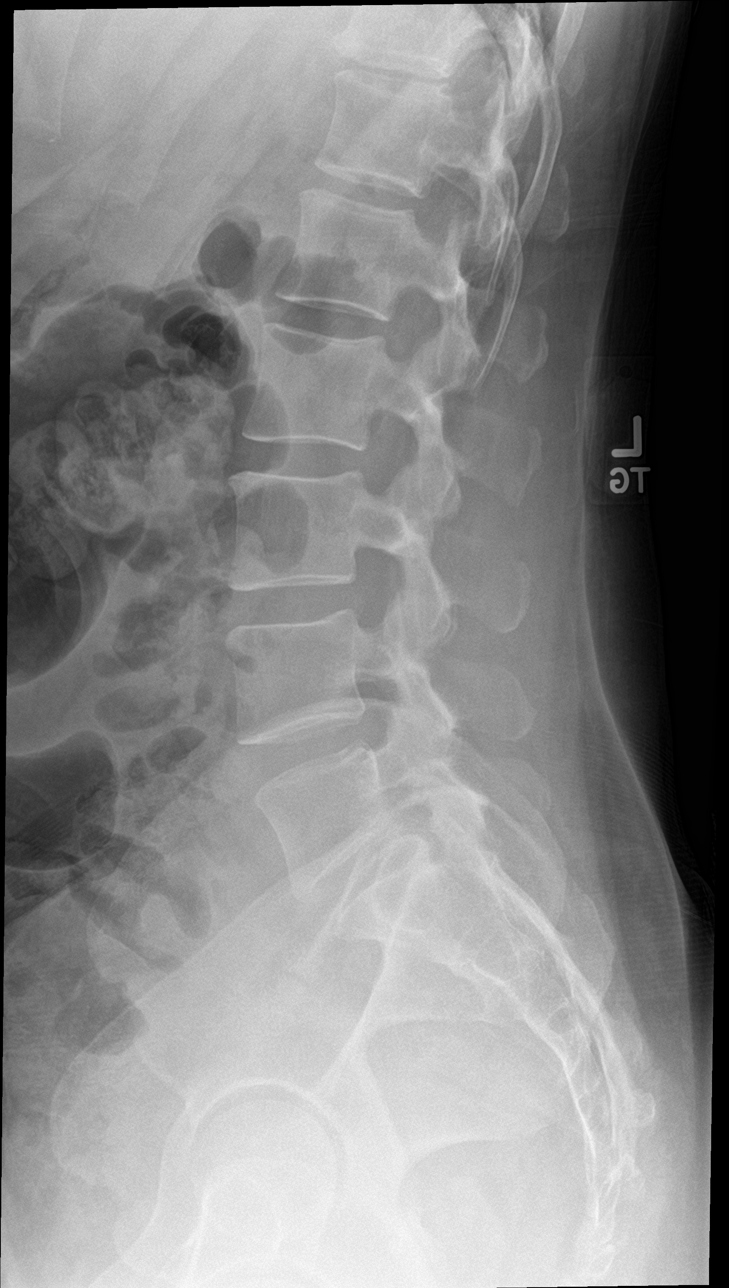

[l-spine spot]
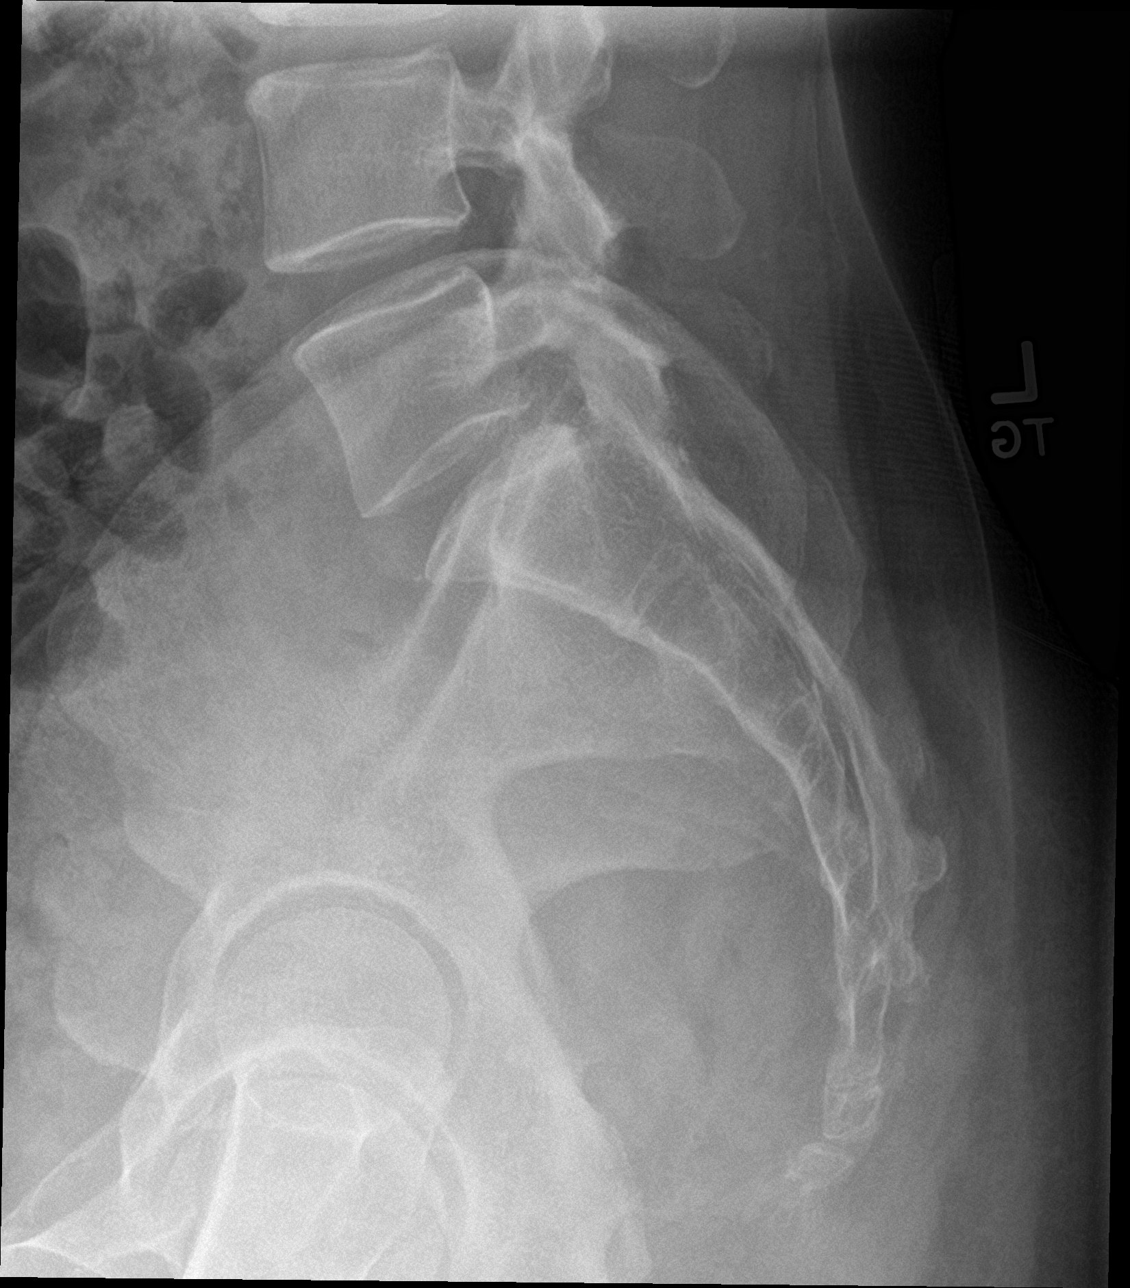

[5 of 5 positions shown; findings below may reference images not displayed]

FINDINGS: Normal lumbar segmentation. Bone mineralization is within normal
limits. Normal thoracic vertebral height and alignment aside from
trace anterolisthesis of L5 on S1. No pars fracture or facet
hypertrophy. Relatively preserved lumbar disc spaces. There is mild
anterolisthesis and disc space loss in the lower thoracic spine at
T11-T12. Sacral ala and SI joints appear normal. No acute osseous
abnormality identified. Small calcifications in the pelvis are
likely phleboliths.
IMPRESSION: 1. Normal for age radiographic appearance of the lumbar spine.
2. T11-T12 mild degenerative appearing anterolisthesis with disc
space loss.

## 2018-06-22 ENCOUNTER — Telehealth: Payer: Self-pay | Admitting: *Deleted

## 2018-06-22 NOTE — Telephone Encounter (Signed)
Left patient a message to call and answer screening questions prior to appointment on 06/25/2018 at 9:30am. NO VISITORS and MASKING POLICY was stated in message as well.

## 2018-06-25 ENCOUNTER — Ambulatory Visit (INDEPENDENT_AMBULATORY_CARE_PROVIDER_SITE_OTHER): Payer: BLUE CROSS/BLUE SHIELD | Admitting: Obstetrics & Gynecology

## 2018-06-25 ENCOUNTER — Encounter: Payer: Self-pay | Admitting: Obstetrics & Gynecology

## 2018-06-25 ENCOUNTER — Other Ambulatory Visit: Payer: Self-pay

## 2018-06-25 ENCOUNTER — Ambulatory Visit: Payer: Self-pay | Admitting: Obstetrics & Gynecology

## 2018-06-25 VITALS — BP 125/79 | HR 67 | Ht 65.0 in | Wt 164.0 lb

## 2018-06-25 DIAGNOSIS — Z1151 Encounter for screening for human papillomavirus (HPV): Secondary | ICD-10-CM | POA: Diagnosis not present

## 2018-06-25 DIAGNOSIS — N841 Polyp of cervix uteri: Secondary | ICD-10-CM | POA: Diagnosis not present

## 2018-06-25 DIAGNOSIS — Z01419 Encounter for gynecological examination (general) (routine) without abnormal findings: Secondary | ICD-10-CM | POA: Diagnosis not present

## 2018-06-25 DIAGNOSIS — R102 Pelvic and perineal pain: Secondary | ICD-10-CM

## 2018-06-25 DIAGNOSIS — Z124 Encounter for screening for malignant neoplasm of cervix: Secondary | ICD-10-CM

## 2018-06-25 DIAGNOSIS — R151 Fecal smearing: Secondary | ICD-10-CM

## 2018-06-25 NOTE — Progress Notes (Signed)
Subjective:     Frances Taylor is a 58 y.o. female here for a routine exam.  Current complaints: pain on left side continues.  Pt had 3 cm ovarian cyst.  Pt has not had a f/u US since 2019.  Pt having leakage of stool after drinking dairy.  Small amount of leakage that irrates her perianal area.  Pt has noticed this since she had a bad intestinal infection a few years ago after visiting the [place]. Pt's last pap smear is 2017 at [facility].  Under media--negtaive cytoloty and HRHPV+ (neg for 16 & 1)   Gynecologic History No LMP recorded. Patient is postmenopausal. Contraception: post menopausal status Last Pap: 2017. Results were: neg cytoloty and +HPV Last mammogram: nml per patient.  Results not on chart.  Obstetric History OB History  Gravida Para Term Preterm AB Living  2 2       2   SAB TAB Ectopic Multiple Live Births               # Outcome Date GA Lbr Len/2nd Weight Sex Delivery Anes PTL Lv  2 Para      Vag-Spont     1 Para      Vag-Spont        The following portions of the patient's history were reviewed and updated as appropriate: allergies, current medications, past family history, past medical history, past social history, past surgical history and problem list.  Review of Systems Pertinent items noted in HPI and remainder of comprehensive ROS otherwise negative.    Objective:      Vitals:   06/25/18 0917  BP: 125/79  Pulse: 67  Weight: 164 lb (74.4 kg)  Height: 5\' 5"  (1.651 m)   Vitals:  WNL General appearance: alert, cooperative and no distress  HEENT: Normocephalic, without obvious abnormality, atraumatic Eyes: negative Throat: lips, mucosa, and tongue normal; teeth and gums normal  Respiratory: Clear to auscultation bilaterally  CV: Regular rate and rhythm  Breasts:  Not undressed for exam; Mammogram ordered  GI: Soft, non-tender; bowel sounds normal; no masses,  no organomegaly  GU: External Genitalia:  Tanner V, no  lesion Urethra:  No prolapse   Vagina: Pink, normal rugae, no blood or discharge; mild tenderness along side wall, Left > right  Cervix: No CMT, small sessile fleshy lesion at 9 o'clock  Uterus:  Normal size and contour, non tender  Adnexa: Normal, no masses, non tender  Musculoskeletal: No edema, redness or tenderness in the calves or thighs  Skin: No lesions or rash  Lymphatic: Axillary adenopathy: none     Psychiatric: Normal mood and behavior        Assessment:    Healthy female exam.   Cervical lesion Left lower quadrant pain   Plan:   1.  Pap with cotesting.  Patient consented for cervical biopsy.  Patient agrees to proceed with the procedure.  The cervix was cleaned with a Betadine and a Kevorkian forcep was used to remove the sessile lesion at 9:00.  Good hemostasis noted. 2.  Screening mammogram ordered 3.  Patient states she is up-to-date on her colonoscopy 4.  Patient is to establish primary care again with provider Cummings at [facility] 5.  Stool leakage and LLQ pain / vaginal pain on exam--Pelvic PT 6.  Rpt [place] to f/u ovarian cyst from 2019. 77.  RTC prn; will call with results or schedule WebEx

## 2018-06-27 ENCOUNTER — Telehealth: Payer: Self-pay | Admitting: *Deleted

## 2018-06-27 LAB — CYTOLOGY - PAP
Diagnosis: NEGATIVE
HPV: DETECTED — AB

## 2018-06-27 NOTE — Telephone Encounter (Signed)
-----   Message from Guss Bunde, MD sent at 06/27/2018 12:02 PM EDT ----- Diagnosis Cervix, biopsy, lesion - BENIGN ENDOCERVICAL POLYP - NEGATIVE FOR DYSPLASIA OR MALIGNANCY  RN to call.  Encourage patient to sign up for my chart.

## 2018-06-27 NOTE — Telephone Encounter (Signed)
LM on voicemail of her benign pap and polyp

## 2018-06-28 ENCOUNTER — Telehealth: Payer: Self-pay | Admitting: *Deleted

## 2018-06-28 ENCOUNTER — Encounter: Payer: Self-pay | Admitting: Obstetrics & Gynecology

## 2018-06-28 DIAGNOSIS — B977 Papillomavirus as the cause of diseases classified elsewhere: Secondary | ICD-10-CM | POA: Insufficient documentation

## 2018-06-28 DIAGNOSIS — N870 Mild cervical dysplasia: Secondary | ICD-10-CM | POA: Insufficient documentation

## 2018-06-28 NOTE — Telephone Encounter (Signed)
LM that her pap was neg but was positive for HPV and has had other paps that were HPV.  Instructed patient to call office to schedule a colposcopy and explained exactly what that in details.

## 2018-06-28 NOTE — Telephone Encounter (Signed)
-----   Message from Guss Bunde, MD sent at 06/28/2018  7:43 AM EDT ----- HPV positive in 2017 and with this pap smear.  Pt needs colposcopy.  Please call with appointment.

## 2018-07-02 ENCOUNTER — Other Ambulatory Visit: Payer: Self-pay | Admitting: Obstetrics & Gynecology

## 2018-07-02 ENCOUNTER — Other Ambulatory Visit (HOSPITAL_BASED_OUTPATIENT_CLINIC_OR_DEPARTMENT_OTHER): Payer: Self-pay | Admitting: Obstetrics & Gynecology

## 2018-07-02 DIAGNOSIS — R102 Pelvic and perineal pain: Secondary | ICD-10-CM

## 2018-07-03 ENCOUNTER — Ambulatory Visit: Payer: BLUE CROSS/BLUE SHIELD | Admitting: Physical Therapy

## 2018-07-03 ENCOUNTER — Ambulatory Visit (HOSPITAL_BASED_OUTPATIENT_CLINIC_OR_DEPARTMENT_OTHER)
Admission: RE | Admit: 2018-07-03 | Discharge: 2018-07-03 | Disposition: A | Discharge status: Home / Self Care | Payer: BLUE CROSS/BLUE SHIELD | Source: Ambulatory Visit | Attending: Obstetrics & Gynecology | Admitting: Obstetrics & Gynecology

## 2018-07-03 ENCOUNTER — Other Ambulatory Visit: Payer: BLUE CROSS/BLUE SHIELD

## 2018-07-03 ENCOUNTER — Other Ambulatory Visit: Payer: Self-pay

## 2018-07-03 DIAGNOSIS — R102 Pelvic and perineal pain: Secondary | ICD-10-CM | POA: Diagnosis present

## 2018-07-04 ENCOUNTER — Ambulatory Visit: Payer: BLUE CROSS/BLUE SHIELD

## 2018-07-04 ENCOUNTER — Other Ambulatory Visit: Payer: BLUE CROSS/BLUE SHIELD

## 2018-07-05 ENCOUNTER — Other Ambulatory Visit: Payer: BLUE CROSS/BLUE SHIELD

## 2018-07-09 ENCOUNTER — Other Ambulatory Visit: Payer: BLUE CROSS/BLUE SHIELD

## 2018-07-16 ENCOUNTER — Other Ambulatory Visit (HOSPITAL_COMMUNITY)
Admission: RE | Admit: 2018-07-16 | Discharge: 2018-07-16 | Disposition: A | Discharge status: Home / Self Care | Payer: BLUE CROSS/BLUE SHIELD | Source: Ambulatory Visit | Attending: Family Medicine | Admitting: Family Medicine

## 2018-07-16 ENCOUNTER — Other Ambulatory Visit: Payer: Self-pay

## 2018-07-16 ENCOUNTER — Ambulatory Visit (INDEPENDENT_AMBULATORY_CARE_PROVIDER_SITE_OTHER): Payer: BLUE CROSS/BLUE SHIELD | Admitting: Family Medicine

## 2018-07-16 ENCOUNTER — Encounter: Payer: Self-pay | Admitting: Family Medicine

## 2018-07-16 VITALS — BP 126/85 | HR 73 | Wt 164.0 lb

## 2018-07-16 DIAGNOSIS — B977 Papillomavirus as the cause of diseases classified elsewhere: Secondary | ICD-10-CM | POA: Insufficient documentation

## 2018-07-16 DIAGNOSIS — R87612 Low grade squamous intraepithelial lesion on cytologic smear of cervix (LGSIL): Secondary | ICD-10-CM | POA: Diagnosis not present

## 2018-07-16 DIAGNOSIS — N870 Mild cervical dysplasia: Secondary | ICD-10-CM

## 2018-07-16 NOTE — Patient Instructions (Signed)
Colposcopy, Care After  This sheet gives you information about how to care for yourself after your procedure. Your doctor may also give you more specific instructions. If you have problems or questions, contact your doctor.  What can I expect after the procedure?  If you did not have a tissue sample removed (did not have a biopsy), you may only have some spotting for a few days. You can go back to your normal activities.  If you had a tissue sample removed, it is common to have:   Soreness and pain. This may last for a few days.   Light-headedness.   Mild bleeding from your vagina or dark-colored, grainy discharge from your vagina. This may last for a few days. You may need to wear a sanitary pad.   Spotting for at least 48 hours after the procedure.  Follow these instructions at home:     Take over-the-counter and prescription medicines only as told by your doctor. Ask your doctor what medicines you can start taking again. This is very important if you take blood-thinning medicine.   Do not drive or use heavy machinery while taking prescription pain medicine.   For 3 days, or as long as your doctor tells you, avoid:  ? Douching.  ? Using tampons.  ? Having sex.   If you use birth control (contraception), keep using it.   Limit activity for the first day after the procedure. Ask your doctor what activities are safe for you.   It is up to you to get the results of your procedure. Ask your doctor when your results will be ready.   Keep all follow-up visits as told by your doctor. This is important.  Contact a doctor if:   You get a skin rash.  Get help right away if:   You are bleeding a lot from your vagina. It is a lot of bleeding if you are using more than one pad an hour for 2 hours in a row.   You have clumps of blood (blood clots) coming from your vagina.   You have a fever.   You have chills   You have pain in your lower belly (pelvic area).   You have signs of infection, such as vaginal  discharge that is:  ? Different than usual.  ? Yellow.  ? Bad-smelling.   You have very pain or cramps in your lower belly that do not get better with medicine.   You feel light-headed.   You feel dizzy.   You pass out (faint).  Summary   If you did not have a tissue sample removed (did not have a biopsy), you may only have some spotting for a few days. You can go back to your normal activities.   If you had a tissue sample removed, it is common to have mild pain and spotting for 48 hours.   For 3 days, or as long as your doctor tells you, avoid douching, using tampons and having sex.   Get help right away if you have bleeding, very bad pain, or signs of infection.  This information is not intended to replace advice given to you by your health care provider. Make sure you discuss any questions you have with your health care provider.  Document Released: 06/29/2007 Document Revised: 09/30/2015 Document Reviewed: 09/30/2015  Elsevier Interactive Patient Education  2019 Elsevier Inc.

## 2018-07-16 NOTE — Progress Notes (Signed)
    GYNECOLOGY CLINIC COLPOSCOPY PROCEDURE NOTE  58 y.o. G2P2 here for colposcopy for no abnormalities pap smear on 06/25/2018 and + HPV x 3 years. Discussed role for HPV in cervical dysplasia, need for surveillance.  Patient given informed consent, signed copy in the chart, time out was performed.  Placed in lithotomy position. Cervix viewed with speculum and colposcope after application of acetic acid.   Colposcopy adequate? Yes  no visible lesions; 3 quadrant biopsies obtained.  ECC specimen obtained. All specimens were labeled and sent to pathology.  Patient was given post procedure instructions.  Will follow up pathology and manage accordingly; patient will be contacted with results and recommendations.  Routine preventative health maintenance measures emphasized.   Donnamae Jude, MD 07/16/2018 2:44 PM

## 2018-07-20 ENCOUNTER — Telehealth: Payer: Self-pay | Admitting: *Deleted

## 2018-07-20 NOTE — Telephone Encounter (Signed)
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

## 2018-07-20 NOTE — Telephone Encounter (Signed)
-----   Message from Donnamae Jude, MD sent at 07/19/2018  1:43 PM EDT ----- Has CIN1--f/u pap in 1 year.

## 2018-07-25 ENCOUNTER — Ambulatory Visit: Payer: BLUE CROSS/BLUE SHIELD

## 2018-08-16 ENCOUNTER — Ambulatory Visit: Payer: BLUE CROSS/BLUE SHIELD

## 2018-08-20 ENCOUNTER — Telehealth: Payer: Self-pay | Admitting: *Deleted

## 2018-08-20 NOTE — Telephone Encounter (Signed)
Left patient a message to call and schedule appointment for 1:15pm today 08/20/2018 if she still needs/wants appointment. Patient asked to be called if an appointment opened up today. Patient states that she is still having ovarian pain.

## 2018-09-10 ENCOUNTER — Ambulatory Visit: Payer: BLUE CROSS/BLUE SHIELD | Admitting: Obstetrics & Gynecology

## 2018-09-27 ENCOUNTER — Ambulatory Visit: Payer: BLUE CROSS/BLUE SHIELD

## 2019-04-11 ENCOUNTER — Encounter: Payer: Self-pay | Admitting: Medical-Surgical

## 2019-04-11 ENCOUNTER — Other Ambulatory Visit: Payer: Self-pay

## 2019-04-11 ENCOUNTER — Ambulatory Visit (INDEPENDENT_AMBULATORY_CARE_PROVIDER_SITE_OTHER): Payer: 59 | Admitting: Medical-Surgical

## 2019-04-11 VITALS — BP 147/86 | HR 70 | Temp 97.7°F | Ht 63.25 in | Wt 168.7 lb

## 2019-04-11 DIAGNOSIS — K649 Unspecified hemorrhoids: Secondary | ICD-10-CM

## 2019-04-11 DIAGNOSIS — Z1231 Encounter for screening mammogram for malignant neoplasm of breast: Secondary | ICD-10-CM

## 2019-04-11 DIAGNOSIS — Z23 Encounter for immunization: Secondary | ICD-10-CM

## 2019-04-11 MED ORDER — HYDROCORTISONE ACETATE 25 MG RE SUPP: 25.0000 mg | Freq: Two times a day (BID) | RECTAL | 2 refills | Status: DC | PRN

## 2019-04-11 NOTE — Progress Notes (Signed)
Subjective:    CC: establish care, hemorrhoids  HPI: Pleasant 59 year old female presenting to establish care with new PCP. Has been receiving care at her OBGYN since 2019. Due for Tdap and mammogram. Recently had biometric testing completed at [facility].   Has had difficulty with digestive issues for a couple of years but the last 2 months has had problems with hemorrhoids. Experiencing rectal pressure, itching, and irritation. Regular daily BMs that are formed and some occasional straining that makes hemorrhoids worse. No blood in stools. Reports being able to push hemorrhoids back in. Using OTC hemorrhoid cream and wipes with temporary mild relief.  BP elevated at appointment today but patient reports it was normal during biometric testing 3 weeks ago. No prior history of elevated BP.   I reviewed the past medical history, family history, social history, surgical history, and allergies today and no changes were needed.  Please see the problem list section below in epic for further details.  Past Medical History: Past Medical History:  Diagnosis Date  . Abnormal Pap smear of cervix   . Cervical high risk HPV (human papillomavirus) test positive   . GERD (gastroesophageal reflux disease)   . Headache   . Hypertension    took herself off medication, lifestyle   Past Surgical History: Past Surgical History:  Procedure Laterality Date  . IRRIGATION AND DEBRIDEMENT SEBACEOUS CYST     scalp   Social History: Social History   Socioeconomic History  . Marital status: Single    Spouse name: Not on file  . Number of children: Not on file  . Years of education: Not on file  . Highest education level: Not on file  Occupational History  . Not on file  Tobacco Use  . Smoking status: Never Smoker  . Smokeless tobacco: Never Used  Substance and Sexual Activity  . Alcohol use: No  . Drug use: No  . Sexual activity: Not Currently    Birth control/protection: None, Post-menopausal   Other Topics Concern  . Not on file  Social History Narrative  . Not on file   Social Determinants of Health   Financial Resource Strain:   . Difficulty of Paying Living Expenses:   Food Insecurity:   . Worried About Charity fundraiser in the Last Year:   . Arboriculturist in the Last Year:   Transportation Needs:   . Film/video editor (Medical):   Marland Kitchen Lack of Transportation (Non-Medical):   Physical Activity:   . Days of Exercise per Week:   . Minutes of Exercise per Session:   Stress:   . Feeling of Stress :   Social Connections:   . Frequency of Communication with Friends and Family:   . Frequency of Social Gatherings with Friends and Family:   . Attends Religious Services:   . Active Member of Clubs or Organizations:   . Attends Archivist Meetings:   Marland Kitchen Marital Status:    Family History: Family History  Problem Relation Age of Onset  . Cancer Father        Pituitary  . Diabetes Paternal Grandmother   . Heart attack Paternal Grandfather   . Breast cancer Paternal Aunt   . Prostate cancer Paternal Uncle    Allergies: No Known Allergies Medications: See med rec.  Review of Systems: No fevers, chills, night sweats, weight loss, chest pain, or shortness of breath.   Objective:    General: Well Developed, well nourished, and in no acute  distress.  Neuro: Alert and oriented x3.  HEENT: Normocephalic, atraumatic.  Skin: Warm and dry. Cardiac: Regular rate and rhythm, no murmurs rubs or gallops, no lower extremity edema.  Respiratory: Clear to auscultation bilaterally. Not using accessory muscles, speaking in full sentences. Rectal exam: small external hemorrhoid noted. Internal exam deferred for patient comfort. Will treat on symptoms.  Impression and Recommendations:    1. Hemorrhoids, unspecified hemorrhoid type Anusol suppositories twice daily as needed. Consider increasing dietary fiber and hydration. May continue using OTC creams and wipes if  desired. - hydrocortisone (ANUSOL-HC) 25 MG suppository; Place 1 suppository (25 mg total) rectally 2 (two) times daily as needed for hemorrhoids.  Dispense: 24 suppository; Refill: 2  2. Encounter for screening mammogram for malignant neoplasm of breast Mammogram ordered. - MM 3D SCREEN BREAST BILATERAL  3. Need for Tdap vaccine Tdap given.  Return in about 6 months (around 10/12/2019) for Blood pressure, GERD follow up.  ___________________________________________ Clearnce Sorrel, DNP, APRN, FNP-BC [facility]

## 2019-04-12 ENCOUNTER — Telehealth: Payer: Self-pay

## 2019-04-12 NOTE — Telephone Encounter (Signed)
When using suppositories, there is always a bit of rectal stimulation that can prompt bowel movements. As the medication melts, there may be a small amount of leakage from the rectum. While it may be uncomfortable, this is not abnormal. You can continue using the suppositories if desired or stop them if the leakage is too bothersome. Continue using the over the counter hemorrhoid cream and wipes as discussed.  Some other things you can do for your hemorrhoids include increasing daily water intake, taking a fiber supplement, and avoiding straining with stools. Taking a daily stool softener such as docusate sodium (available over the counter) or Miralax can help to keep stools soft and prevent the need to strain with defecation.

## 2019-04-12 NOTE — Telephone Encounter (Signed)
Patient advised of recommendations.  

## 2019-04-12 NOTE — Telephone Encounter (Signed)
[name] called and left a message stating there is a problem with the hemorrhoid medication. I called and left a message for a return call.

## 2019-04-12 NOTE — Telephone Encounter (Signed)
Frances Taylor called and reports after using the suppository she is having bowel incontinence. Please advise.

## 2019-06-17 ENCOUNTER — Other Ambulatory Visit: Payer: Self-pay

## 2019-06-17 ENCOUNTER — Encounter: Payer: Self-pay | Admitting: Obstetrics & Gynecology

## 2019-06-17 ENCOUNTER — Ambulatory Visit (INDEPENDENT_AMBULATORY_CARE_PROVIDER_SITE_OTHER): Payer: 59 | Admitting: Obstetrics & Gynecology

## 2019-06-17 ENCOUNTER — Other Ambulatory Visit (HOSPITAL_COMMUNITY)
Admission: RE | Admit: 2019-06-17 | Discharge: 2019-06-17 | Disposition: A | Discharge status: Home / Self Care | Payer: 59 | Source: Ambulatory Visit | Attending: Obstetrics & Gynecology | Admitting: Obstetrics & Gynecology

## 2019-06-17 VITALS — BP 120/73 | HR 75 | Ht 65.0 in | Wt 168.0 lb

## 2019-06-17 DIAGNOSIS — N870 Mild cervical dysplasia: Secondary | ICD-10-CM

## 2019-06-17 DIAGNOSIS — N83202 Unspecified ovarian cyst, left side: Secondary | ICD-10-CM | POA: Diagnosis not present

## 2019-06-17 DIAGNOSIS — R102 Pelvic and perineal pain: Secondary | ICD-10-CM

## 2019-06-17 NOTE — Progress Notes (Signed)
Subjective:     Frances Taylor is a 59 y.o. female here for a routine exam.  Current complaints: left pelvic pain, has known left ovarian cyst.  Pain is in morning and takes tylenol.  Pt stopped sodas which helped some.  No bleeding.  Pt has hemorrhoids and on a suppository.  No bladder complaints   Gynecologic History No LMP recorded. Patient is postmenopausal. 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  Last mammogram: 2020. Results were: normal  Obstetric History OB History  Gravida Para Term Preterm AB Living  2 2       2   SAB TAB Ectopic Multiple Live Births               # Outcome Date GA Lbr Len/2nd Weight Sex Delivery Anes PTL Lv  2 Para      Vag-Spont     1 Para      Vag-Spont      [place] from June 2020  Suboptimal visualization of uterus and nonvisualization of RIGHT ovary.  Suspected 7 mm intramural leiomyoma at uterine fundus.  4.2 cm diameter simple appearing cyst in LEFT adnexa, question of ovarian or paraovarian origin.  No other LEFT ovarian tissue or other adnexal masses identified.  The following portions of the patient's history were reviewed and updated as appropriate: allergies, current medications, past family history, past medical history, past social history, past surgical history and problem list.  Review of Systems Pertinent items noted in HPI and remainder of comprehensive ROS otherwise negative.    Objective:      Vitals:   06/17/19 1556  BP: 120/73  Pulse: 75  Weight: 168 lb (76.2 kg)  Height: 5\' 5"  (1.651 m)   Vitals:  WNL General appearance: alert, cooperative and no distress  HEENT: Normocephalic, without obvious abnormality, atraumatic Eyes: negative Throat: lips, mucosa, and tongue normal; teeth and gums normal  Respiratory: Clear to auscultation bilaterally  CV: Regular rate and rhythm  Breasts:  Normal appearance, no masses or tenderness, no nipple retraction or dimpling  GI: Soft,  non-tender; bowel sounds normal; no masses,  no organomegaly  GU: External Genitalia:  Tanner V, no lesion Urethra:  No prolapse   Vagina: Pink, normal rugae, no blood or discharge  Cervix: No CMT, no lesion  Uterus:  Non tender, limited by habitus  Adnexa: Non tender, limited by habitus  Musculoskeletal: No edema, redness or tenderness in the calves or thighs  Skin: No lesions or rash  Lymphatic: Axillary adenopathy: none     Psychiatric: Normal mood and behavior        Assessment:    Healthy female exam.    Plan:    1.  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  Pelvic US with TVUS ordered. 4.  Will follow up based on results.

## 2019-06-19 LAB — CYTOLOGY - PAP
Comment: NEGATIVE
High risk HPV: POSITIVE — AB

## 2019-06-27 ENCOUNTER — Telehealth: Payer: Self-pay

## 2019-06-27 ENCOUNTER — Ambulatory Visit: Admission: RE | Admit: 2019-06-27 | Payer: 59 | Source: Ambulatory Visit

## 2019-06-27 NOTE — Telephone Encounter (Signed)
Attempted to call pt to make her aware of U/S appt scheduled for 6/4 at 3:00. Phone rang one time and then turned off. Unable to leave VM. MyChart message sent to pt with U/S appt information.

## 2019-06-28 ENCOUNTER — Ambulatory Visit
Admission: RE | Admit: 2019-06-28 | Discharge: 2019-06-28 | Disposition: A | Discharge status: Home / Self Care | Payer: 59 | Source: Ambulatory Visit | Attending: Obstetrics & Gynecology | Admitting: Obstetrics & Gynecology

## 2019-06-28 ENCOUNTER — Other Ambulatory Visit: Payer: Self-pay

## 2019-06-28 DIAGNOSIS — R102 Pelvic and perineal pain: Secondary | ICD-10-CM | POA: Diagnosis present

## 2019-07-19 ENCOUNTER — Other Ambulatory Visit: Payer: Self-pay | Admitting: Obstetrics & Gynecology

## 2019-07-19 DIAGNOSIS — N83209 Unspecified ovarian cyst, unspecified side: Secondary | ICD-10-CM

## 2019-07-19 NOTE — Progress Notes (Signed)
ca125--postmenopausal simple cyst.

## 2019-07-22 ENCOUNTER — Other Ambulatory Visit: Payer: Self-pay | Admitting: Obstetrics & Gynecology

## 2019-07-22 ENCOUNTER — Other Ambulatory Visit: Payer: Self-pay

## 2019-07-22 ENCOUNTER — Encounter: Payer: Self-pay | Admitting: Obstetrics & Gynecology

## 2019-07-22 ENCOUNTER — Encounter: Payer: Self-pay | Admitting: *Deleted

## 2019-07-22 ENCOUNTER — Ambulatory Visit (INDEPENDENT_AMBULATORY_CARE_PROVIDER_SITE_OTHER): Payer: 59 | Admitting: Obstetrics & Gynecology

## 2019-07-22 VITALS — BP 120/73 | HR 72 | Resp 16 | Ht 65.0 in | Wt 168.0 lb

## 2019-07-22 DIAGNOSIS — N83292 Other ovarian cyst, left side: Secondary | ICD-10-CM | POA: Diagnosis not present

## 2019-07-22 DIAGNOSIS — R102 Pelvic and perineal pain: Secondary | ICD-10-CM

## 2019-07-22 DIAGNOSIS — D252 Subserosal leiomyoma of uterus: Secondary | ICD-10-CM | POA: Diagnosis not present

## 2019-07-22 DIAGNOSIS — N870 Mild cervical dysplasia: Secondary | ICD-10-CM | POA: Diagnosis not present

## 2019-07-22 DIAGNOSIS — N83209 Unspecified ovarian cyst, unspecified side: Secondary | ICD-10-CM

## 2019-07-22 NOTE — Progress Notes (Signed)
Colposcopy Procedure Note  Indications: Pap smear 1 months ago showed: ASCUS with POSITIVE high risk HPV. The prior pap showed normal cytology, +HPV.  Prior cervical/vaginal disease: normal exam without visible pathology. Prior cervical treatment: no treatment.  Procedure Details  The risks and benefits of the procedure and Written informed consent obtained.  Speculum placed in vagina and excellent visualization of cervix achieved, cervix swabbed x 3 with acetic acid solution.  Findings: Cervix: acetowhite lesion(s) noted at 6 & 8 o'clock; cervix swabbed with Lugol's solution, SCJ visualized - lesion at 6 & 8  o'clock and endocervical curettage performed. Vaginal inspection: vaginal colposcopy not performed. Vulvar colposcopy: vulvar colposcopy not performed.  Specimens: biopsy at 6 & 8 c'clock & ECC  Complications: none.  Plan: Specimens labelled and sent to Pathology. Post biopsy instructions given to patient.

## 2019-07-23 LAB — CA 125: CA 125: 2 U/mL (ref <35)

## 2019-07-25 ENCOUNTER — Encounter: Payer: Self-pay | Admitting: Obstetrics & Gynecology

## 2019-07-25 DIAGNOSIS — N83202 Unspecified ovarian cyst, left side: Secondary | ICD-10-CM | POA: Insufficient documentation

## 2019-07-25 NOTE — Progress Notes (Signed)
   Subjective:    Patient ID: Frances Taylor, female    DOB: 1960-09-14, 59 y.o.   MRN: [id number]  HPI  59 yo female presents for colposcopy and discussion of ovarian cyst.  See colposcopy note.  Ultrasound on June 28, 2019 FINDINGS: Uterus  Measurements: 6.8 x 2.8 x 4.4 cm = volume: 45 mL. Small anterior subserosal fibroid measures 9 mm.  Endometrium  Thickness: 6 mm in thickness.  No focal abnormality visualized.  Right ovary  Measurements: 4.3 x 2.2 x 1.8 cm = volume: 9.0 mL. Normal appearance/no adnexal mass.  Left ovary  Measurements: 3.9 x 2.2 x 2.3 cm = volume: 10.2 mL. 3.6 cm simple appearing cyst  Other findings  No abnormal free fluid.  IMPRESSION: Small 9 mm subserosal fibroid anteriorly.  3.6 cm simple appearing cyst in the left ovary. This previously measured up to 4.2 cm.  Review of Systems  Constitutional: Negative.   Respiratory: Negative.   Cardiovascular: Negative.   Gastrointestinal: Negative.   Genitourinary: Negative.        Objective:   Physical Exam Vitals reviewed.  Constitutional:      General: She is not in acute distress.    Appearance: She is well-developed.  HENT:     Head: Normocephalic and atraumatic.  Eyes:     Conjunctiva/sclera: Conjunctivae normal.  Cardiovascular:     Rate and Rhythm: Normal rate.  Pulmonary:     Effort: Pulmonary effort is normal.  Skin:    General: Skin is warm and dry.  Neurological:     Mental Status: She is alert and oriented to person, place, and time.    Vitals:   07/22/19 1553  BP: 120/73  Pulse: 72  Resp: 16  Weight: 168 lb (76.2 kg)  Height: 5\' 5"  (1.651 m)    Assessment & Plan:  59 yo female with stable simple left ovarian cyst.  1.  Check CA 125 2.  Continue to follow with year [place] if CA 125 is normal 3.  Colposcopy (see separate procedure note)

## 2019-08-26 ENCOUNTER — Ambulatory Visit (INDEPENDENT_AMBULATORY_CARE_PROVIDER_SITE_OTHER): Payer: 59 | Admitting: Obstetrics & Gynecology

## 2019-08-26 ENCOUNTER — Encounter: Payer: Self-pay | Admitting: Obstetrics & Gynecology

## 2019-08-26 ENCOUNTER — Other Ambulatory Visit: Payer: Self-pay

## 2019-08-26 VITALS — BP 120/71 | HR 84 | Resp 16 | Ht 65.0 in | Wt 164.0 lb

## 2019-08-26 DIAGNOSIS — N72 Inflammatory disease of cervix uteri: Secondary | ICD-10-CM

## 2019-08-26 DIAGNOSIS — S3769XA Other injury of uterus, initial encounter: Secondary | ICD-10-CM

## 2019-08-26 DIAGNOSIS — R102 Pelvic and perineal pain: Secondary | ICD-10-CM | POA: Diagnosis not present

## 2019-08-26 MED ORDER — DOXYCYCLINE HYCLATE 100 MG PO CAPS: 100.0000 mg | ORAL_CAPSULE | Freq: Two times a day (BID) | ORAL | 0 refills | Status: DC

## 2019-08-26 NOTE — Progress Notes (Signed)
   Subjective:    Patient ID: Frances Taylor, female    DOB: November 23, 1960, 59 y.o.   MRN: [id number]  HPI  Patient was presenting appropriately due to persistent CIN and HPV.  The ECC showed CIN-1.  Patient that she had increased discharge that was malodorous after the biopsy.  Upon exam today the patient biopsy sites are not well-healed and consistent with a mild infection at the biopsy sites.  Patient still has abdominal cramping.  Thinks is worse after drinking [facility].  She has negative colonoscopy and a ultrasound shows a simple cyst that has shrunk in size.  Review of Systems  HENT: Positive for dental problem.   Respiratory: Negative.   Cardiovascular: Negative.   Genitourinary: Positive for pelvic pain and vaginal discharge.  Psychiatric/Behavioral: Negative.        Objective:   Physical Exam Vitals reviewed.  Constitutional:      General: She is not in acute distress.    Appearance: She is well-developed.  HENT:     Head: Normocephalic and atraumatic.  Eyes:     Conjunctiva/sclera: Conjunctivae normal.  Cardiovascular:     Rate and Rhythm: Normal rate.  Pulmonary:     Effort: Pulmonary effort is normal.  Genitourinary:    Comments: Biopsy sites are not completely healed.  It looks like there with a mild infection on the portia of the cervix.   Skin:    General: Skin is warm and dry.  Neurological:     Mental Status: She is alert and oriented to person, place, and time.    Vitals:   08/26/19 1314  BP: 120/71  Pulse: 84  Resp: 16  Weight: 164 lb (74.4 kg)  Height: 5\' 5"  (1.651 m)       Assessment & Plan:  59 yo female with mile infection at cervical biopsy sites  We will treat with doxycycline 100 mg twice daily for a week.  Patient to come back in 4 weeks and 2 to 3 weeks.  Discussed CT scan for abdominal pain.  At this paint, pt will decrease caffeine intake and see if this improves symptoms

## 2019-10-07 ENCOUNTER — Encounter: Payer: Self-pay | Admitting: Obstetrics & Gynecology

## 2019-10-07 ENCOUNTER — Ambulatory Visit (INDEPENDENT_AMBULATORY_CARE_PROVIDER_SITE_OTHER): Payer: 59 | Admitting: Obstetrics & Gynecology

## 2019-10-07 ENCOUNTER — Other Ambulatory Visit: Payer: Self-pay | Admitting: Obstetrics & Gynecology

## 2019-10-07 ENCOUNTER — Other Ambulatory Visit: Payer: Self-pay | Admitting: *Deleted

## 2019-10-07 ENCOUNTER — Other Ambulatory Visit: Payer: Self-pay

## 2019-10-07 VITALS — BP 119/72 | HR 89 | Resp 16 | Ht 65.0 in | Wt 168.0 lb

## 2019-10-07 DIAGNOSIS — N870 Mild cervical dysplasia: Secondary | ICD-10-CM | POA: Diagnosis not present

## 2019-10-07 MED ORDER — DOXYCYCLINE HYCLATE 100 MG PO CAPS: 100.0000 mg | ORAL_CAPSULE | Freq: Two times a day (BID) | ORAL | 0 refills | Status: DC

## 2019-10-12 NOTE — Progress Notes (Signed)
LEEP PROCEDURE NOTE Pap smear and colposcopy reviewed.   Pap ASCUS with HPV+ Colpo Biopsy CIN 1 (persistent) ECC CIN 1 Risks, benefits, alternatives, and limitations of procedure explained to patient, including pain, bleeding, infection, failure to remove abnormal tissue and failure to cure dysplasia, need for repeat procedures, damage to pelvic organs, cervical incompetence.  Role of HPV,cervical dysplasia and need for close followup was empasized. Informed written consent was obtained. All questions were answered. Time out performed.  Procedure: The patient was placed in lithotomy position and the bivalved coated speculum was placed in the patient's vagina. A grounding pad placed on the patient. Lugol's solution was applied to the cervix and areas of decreased uptake were noted 5-8 o'clock.   Local anesthesia was administered via an intracervical block using 10cc of 2% Lidocaine with epinephrine. The suction was turned on and the Medium 1X Fisher Cone Biopsy Excisor on 69 Watts of cutting current was used to excise the area of decreased uptake and excise the entire transformation zone. Excellent hemostasis was achieved using roller ball coagulation set at 50 Watts coagulation current. Monsel's solution was then applied and the speculum was removed from the vagina. Specimens were sent to pathology. The patient tolerated the procedure well. Post-operative instructions given to patient, including instruction to seek medical attention for persistent bright red bleeding, fever, abdominal/pelvic pain, dysuria, nausea or vomiting. She was also told about the possibility of having copious yellow to black tinged discharge. She was counseled to avoid anything in the vagina (sex/douching/tampons) for 4 weeks. She has a  3 week post-operative check to review results and assess wound healing.  Pt developed infection after the cervical biopsy.  Doxycycline prescribed for 5 days to help prevent infection this time.

## 2019-10-21 ENCOUNTER — Encounter: Payer: Self-pay | Admitting: Obstetrics & Gynecology

## 2019-10-21 ENCOUNTER — Ambulatory Visit (INDEPENDENT_AMBULATORY_CARE_PROVIDER_SITE_OTHER): Payer: 59 | Admitting: Obstetrics & Gynecology

## 2019-10-21 ENCOUNTER — Other Ambulatory Visit: Payer: Self-pay

## 2019-10-21 VITALS — BP 124/72 | HR 75 | Resp 16 | Ht 65.0 in | Wt 166.0 lb

## 2019-10-21 DIAGNOSIS — N83202 Unspecified ovarian cyst, left side: Secondary | ICD-10-CM

## 2019-10-21 DIAGNOSIS — N870 Mild cervical dysplasia: Secondary | ICD-10-CM | POA: Diagnosis not present

## 2019-10-21 NOTE — Progress Notes (Signed)
   Subjective:    Patient ID: Frances Taylor, female    DOB: 29-Apr-1960, 59 y.o.   MRN: [id number]  HPI  Frances Taylor returns after her LEEP.  She has done well.  She took antibiotics which help prevent infection, she had an infection after her cervical biopsy.  No malodorous discharge.  Not sexually active currently and is not planning to do in the near future.  Patient still has left ovarian cyst that will need follow-up and patient is aware.  Review of Systems     Objective:   Physical Exam Vitals reviewed.  Constitutional:      General: She is not in acute distress.    Appearance: She is well-developed.  HENT:     Head: Normocephalic and atraumatic.  Eyes:     Conjunctiva/sclera: Conjunctivae normal.  Cardiovascular:     Rate and Rhythm: Normal rate.  Pulmonary:     Effort: Pulmonary effort is normal.  Genitourinary:    Comments: Area is still healing.  The top blate of the speculum caused some disruption of the anterior lip of the cervix.  Small amount of blood from this area.  No discharge or evidence of infection.   Skin:    General: Skin is warm and dry.  Neurological:     Mental Status: She is alert and oriented to person, place, and time.    Vitals:   10/21/19 1058  BP: 124/72  Pulse: 75  Resp: 16  Weight: 166 lb (75.3 kg)  Height: 5\' 5"  (1.651 m)      Assessment & Plan:  58 year old female status post LEEP.  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  Patient needs repeat Pap smear with HPV in 1 year.  June 2022--follow-up transvaginal ultrasound to evaluate left ovarian cyst.  Mammogram is ordered but not completed yet.

## 2019-12-23 ENCOUNTER — Encounter: Payer: Self-pay | Admitting: *Deleted

## 2020-04-01 ENCOUNTER — Ambulatory Visit (INDEPENDENT_AMBULATORY_CARE_PROVIDER_SITE_OTHER): Payer: 59 | Admitting: Medical-Surgical

## 2020-04-01 ENCOUNTER — Other Ambulatory Visit: Payer: Self-pay

## 2020-04-01 ENCOUNTER — Encounter: Payer: Self-pay | Admitting: Medical-Surgical

## 2020-04-01 VITALS — BP 126/76 | HR 67 | Temp 98.7°F | Ht 65.0 in | Wt 174.8 lb

## 2020-04-01 DIAGNOSIS — J0100 Acute maxillary sinusitis, unspecified: Secondary | ICD-10-CM | POA: Diagnosis not present

## 2020-04-01 MED ORDER — AMOXICILLIN-POT CLAVULANATE 875-125 MG PO TABS: 1.0000 | ORAL_TABLET | Freq: Two times a day (BID) | ORAL | 0 refills | Status: AC

## 2020-04-01 NOTE — Progress Notes (Signed)
Subjective:    CC: sinus pressure/pain  HPI: Pleasant 60 year old female presenting with complaints of approximately 1 month of sinus congestion with pain and pressure. Has had eye pain and swelling and headaches regularly since she developed congestion. Did originally have a cough and is not sure if it was a URI that changed to a sinus infection. Yesterday, notes the pain was so severe that she was in tears. Denies fever, chills, chest pain, sore throat, shortness of breath, ear pain/pressure, and GI symptoms. Has tried a few OTC remedies but none have been helpful in resolving her symptoms.   I reviewed the past medical history, family history, social history, surgical history, and allergies today and no changes were needed.  Please see the problem list section below in epic for further details.  Past Medical History: Past Medical History:  Diagnosis Date  . Abnormal Pap smear of cervix   . Cervical high risk HPV (human papillomavirus) test positive   . GERD (gastroesophageal reflux disease)   . Headache   . Hypertension    took herself off medication, lifestyle   Past Surgical History: Past Surgical History:  Procedure Laterality Date  . IRRIGATION AND DEBRIDEMENT SEBACEOUS CYST     scalp   Social History: Social History   Socioeconomic History  . Marital status: Single    Spouse name: Not on file  . Number of children: Not on file  . Years of education: Not on file  . Highest education level: Not on file  Occupational History  . Not on file  Tobacco Use  . Smoking status: Never Smoker  . Smokeless tobacco: Never Used  Vaping Use  . Vaping Use: Never used  Substance and Sexual Activity  . Alcohol use: No  . Drug use: No  . Sexual activity: Not Currently    Birth control/protection: None, Post-menopausal  Other Topics Concern  . Not on file  Social History Narrative  . Not on file   Social Determinants of Health   Financial Resource Strain: Not on file  Food  Insecurity: Not on file  Transportation Needs: Not on file  Physical Activity: Not on file  Stress: Not on file  Social Connections: Not on file   Family History: Family History  Problem Relation Age of Onset  . Cancer Father        Pituitary  . Diabetes Paternal Grandmother   . Heart attack Paternal Grandfather   . Breast cancer Paternal Aunt   . Prostate cancer Paternal Uncle    Allergies: No Known Allergies Medications: See med rec.  Review of Systems: See HPI for pertinent positives and negatives.   Objective:    General: Well Developed, well nourished, and in no acute distress.  Neuro: Alert and oriented x3.  HEENT: Normocephalic, atraumatic, pupils equal round reactive to light, neck supple, no masses, + submadibular lymphadenopathy, thyroid nonpalpable. Bilateral TMs normal. Maxillary sinus tenderness and edema. Lower eyelid edema.  Skin: Warm and dry. Cardiac: Regular rate and rhythm, no murmurs rubs or gallops, no lower extremity edema.  Respiratory: Clear to auscultation bilaterally. Not using accessory muscles, speaking in full sentences.  Impression and Recommendations:    1. Acute non-recurrent maxillary sinusitis Start Augmentin BID x 7 days. Recommend daily OTC antihistamine as well as daily Flonase. Consider adding Mucinex to help thin secretions. Increase PO fluids.   Return if symptoms worsen or fail to improve. ___________________________________________ Clearnce Sorrel, DNP, APRN, FNP-BC [facility] [place]

## 2020-05-14 ENCOUNTER — Encounter: Payer: 59 | Admitting: Medical-Surgical

## 2020-05-29 ENCOUNTER — Encounter: Payer: Self-pay | Admitting: Medical-Surgical

## 2020-05-29 ENCOUNTER — Ambulatory Visit (INDEPENDENT_AMBULATORY_CARE_PROVIDER_SITE_OTHER): Payer: 59 | Admitting: Medical-Surgical

## 2020-05-29 ENCOUNTER — Other Ambulatory Visit: Payer: Self-pay

## 2020-05-29 VITALS — BP 114/70 | HR 62 | Temp 98.4°F | Ht 65.0 in | Wt 169.0 lb

## 2020-05-29 DIAGNOSIS — F418 Other specified anxiety disorders: Secondary | ICD-10-CM | POA: Diagnosis not present

## 2020-05-29 DIAGNOSIS — R5383 Other fatigue: Secondary | ICD-10-CM | POA: Diagnosis not present

## 2020-05-29 NOTE — Progress Notes (Addendum)
Subjective:    CC: fatigue  HPI: Pleasant 60 year old female presenting today to discuss new onset fatigue that started a few weeks ago. She recently moved in with her son who lives in [place]. The move is temporary but it has disturbed her normal way of life. She is not sleeping well due to all the strange noises and the strange environment. Feels that her mood has been off and notes that she snapped at a co-worker the other day which is uncharacteristic for her.  Has not been eating normally due to poor appetite.  Feels like she may have lost some weight.  She is working on saving money to be able to buy a house and does not look like she will be able to move out of her son's home until September of this year.  She does not have any other options for living arrangements at this point.  Denies SI/HI.  I reviewed the past medical history, family history, social history, surgical history, and allergies today and no changes were needed.  Please see the problem list section below in epic for further details.  Past Medical History: Past Medical History:  Diagnosis Date  . Abnormal Pap smear of cervix   . Cervical high risk HPV (human papillomavirus) test positive   . GERD (gastroesophageal reflux disease)   . Headache   . Hypertension    took herself off medication, lifestyle   Past Surgical History: Past Surgical History:  Procedure Laterality Date  . IRRIGATION AND DEBRIDEMENT SEBACEOUS CYST     scalp   Social History: Social History   Socioeconomic History  . Marital status: Single    Spouse name: Not on file  . Number of children: Not on file  . Years of education: Not on file  . Highest education level: Not on file  Occupational History  . Not on file  Tobacco Use  . Smoking status: Never Smoker  . Smokeless tobacco: Never Used  Vaping Use  . Vaping Use: Never used  Substance and Sexual Activity  . Alcohol use: No  . Drug use: No  . Sexual activity: Not Currently     Birth control/protection: None, Post-menopausal  Other Topics Concern  . Not on file  Social History Narrative  . Not on file   Social Determinants of Health   Financial Resource Strain: Not on file  Food Insecurity: Not on file  Transportation Needs: Not on file  Physical Activity: Not on file  Stress: Not on file  Social Connections: Not on file   Family History: Family History  Problem Relation Age of Onset  . Cancer Father        Pituitary  . Diabetes Paternal Grandmother   . Heart attack Paternal Grandfather   . Breast cancer Paternal Aunt   . Prostate cancer Paternal Uncle    Allergies: No Known Allergies Medications: See med rec.  Review of Systems: See HPI for pertinent positives and negatives.   Depression screen [facility] 2/9 05/29/2020 04/11/2019 08/19/2016 04/14/2016  Decreased Interest 1 0 3 0  Down, Depressed, Hopeless 2 0 2 0  PHQ - 2 Score 3 0 5 0  Altered sleeping 3 0 3 -  Tired, decreased energy 3 0 3 -  Change in appetite 2 0 3 -  Feeling bad or failure about yourself  2 0 0 -  Trouble concentrating 2 0 1 -  Moving slowly or fidgety/restless 2 0 2 -  Suicidal thoughts 1 0 0 -  PHQ-9 Score 18 0 17 -  Difficult doing work/chores - Not difficult at all - -   GAD 7 : Generalized Anxiety Score 05/29/2020 04/11/2019  Nervous, Anxious, on Edge 2 0  Control/stop worrying 2 0  Worry too much - different things 1 0  Trouble relaxing 2 0  Restless 2 0  Easily annoyed or irritable 2 0  Afraid - awful might happen 0 0  Total GAD 7 Score 11 0  Anxiety Difficulty - Not difficult at all    Depression screen [facility] 2/9 05/29/2020 04/11/2019 08/19/2016 04/14/2016  Decreased Interest 1 0 3 0  Down, Depressed, Hopeless 2 0 2 0  PHQ - 2 Score 3 0 5 0  Altered sleeping 3 0 3 -  Tired, decreased energy 3 0 3 -  Change in appetite 2 0 3 -  Feeling bad or failure about yourself  2 0 0 -  Trouble concentrating 2 0 1 -  Moving slowly or fidgety/restless 2 0 2 -  Suicidal  thoughts 1 0 0 -  PHQ-9 Score 18 0 17 -  Difficult doing work/chores - Not difficult at all - -   Objective:    General: Well Developed, well nourished, and in no acute distress.  Neuro: Alert and oriented x3.  HEENT: Normocephalic, atraumatic.  Skin: Warm and dry. Cardiac: Regular rate and rhythm.  Respiratory:  Not using accessory muscles, speaking in full sentences.  Impression and Recommendations:    1. Fatigue, unspecified type  After discussion, suspect fatigue is related to poor sleep and depression.  To be sure that there are no other contributing factors, checking labs as below.  - CBC - COMPLETE METABOLIC PANEL WITH GFR - TSH - Fe+TIBC+Fer - VITAMIN D 25 Hydroxy (Vit-D Deficiency, Fractures)  2.  Depression with anxiety Discussed options for treatment of depression with anxiety.  She is not interested in starting a medication and does not feel that she has time for counseling.  Most of her issues are situational and I suspect will resolve once she is out of her son's home.  Discussed herbal and over-the-counter options to help with mood and sleep.  Return if symptoms worsen or fail to improve. ___________________________________________ Clearnce Sorrel, DNP, APRN, FNP-BC [facility] and [place]

## 2020-05-29 NOTE — Patient Instructions (Signed)
Ashwaghanda Kava Camomile Valerian root Melatonin

## 2020-05-30 ENCOUNTER — Encounter: Payer: Self-pay | Admitting: Medical-Surgical

## 2020-05-30 LAB — VITAMIN D 25 HYDROXY (VIT D DEFICIENCY, FRACTURES): Vit D, 25-Hydroxy: 22 ng/mL — ABNORMAL LOW (ref 30–100)

## 2020-05-30 LAB — COMPLETE METABOLIC PANEL WITH GFR
AG Ratio: 1.8 (calc) (ref 1.0–2.5)
ALT: 20 U/L (ref 6–29)
AST: 21 U/L (ref 10–35)
Albumin: 4.1 g/dL (ref 3.6–5.1)
Alkaline phosphatase (APISO): 96 U/L (ref 37–153)
BUN: 9 mg/dL (ref 7–25)
CO2: 31 mmol/L (ref 20–32)
Calcium: 9.4 mg/dL (ref 8.6–10.4)
Chloride: 107 mmol/L (ref 98–110)
Creat: 1.03 mg/dL (ref 0.50–1.05)
GFR, Est African American: 69 mL/min/{1.73_m2} (ref >=60)
GFR, Est Non African American: 60 mL/min/{1.73_m2} (ref >=60)
Globulin: 2.3 g/dL (calc) (ref 1.9–3.7)
Glucose, Bld: 87 mg/dL (ref 65–99)
Potassium: 4 mmol/L (ref 3.5–5.3)
Sodium: 142 mmol/L (ref 135–146)
Total Bilirubin: 0.2 mg/dL (ref 0.2–1.2)
Total Protein: 6.4 g/dL (ref 6.1–8.1)

## 2020-05-30 LAB — IRON,TIBC AND FERRITIN PANEL
%SAT: 19 % (calc) (ref 16–45)
Ferritin: 73 ng/mL (ref 16–232)
Iron: 62 ug/dL (ref 45–160)
TIBC: 331 mcg/dL (calc) (ref 250–450)

## 2020-05-30 LAB — CBC
HCT: 40.3 % (ref 35.0–45.0)
Hemoglobin: 12.7 g/dL (ref 11.7–15.5)
MCH: 25.5 pg — ABNORMAL LOW (ref 27.0–33.0)
MCHC: 31.5 g/dL — ABNORMAL LOW (ref 32.0–36.0)
MCV: 80.8 fL (ref 80.0–100.0)
MPV: 9.7 fL (ref 7.5–12.5)
Platelets: 267 10*3/uL (ref 140–400)
RBC: 4.99 10*6/uL (ref 3.80–5.10)
RDW: 12.7 % (ref 11.0–15.0)
WBC: 5.6 10*3/uL (ref 3.8–10.8)

## 2020-05-30 LAB — TSH: TSH: 2.32 mIU/L (ref 0.40–4.50)

## 2020-06-01 ENCOUNTER — Other Ambulatory Visit: Payer: Self-pay | Admitting: Obstetrics & Gynecology

## 2020-06-01 DIAGNOSIS — N83209 Unspecified ovarian cyst, unspecified side: Secondary | ICD-10-CM

## 2020-06-03 ENCOUNTER — Encounter: Payer: Self-pay | Admitting: Medical-Surgical

## 2020-06-23 ENCOUNTER — Telehealth: Payer: Self-pay | Admitting: *Deleted

## 2020-06-23 NOTE — Telephone Encounter (Signed)
Returned call from 06/19/2020 at 1:48 PM, office closed and on 06/22/2020. Left patient a message to call and reschedule appointment on 06/29/2020 to 07/13/2020 with Dr. Gala Romney.

## 2020-06-26 ENCOUNTER — Other Ambulatory Visit: Payer: 59

## 2020-06-29 ENCOUNTER — Ambulatory Visit: Payer: 59 | Admitting: Obstetrics & Gynecology

## 2020-07-06 ENCOUNTER — Other Ambulatory Visit: Payer: Self-pay

## 2020-07-06 ENCOUNTER — Ambulatory Visit (INDEPENDENT_AMBULATORY_CARE_PROVIDER_SITE_OTHER): Payer: 59

## 2020-07-06 DIAGNOSIS — N83209 Unspecified ovarian cyst, unspecified side: Secondary | ICD-10-CM

## 2020-07-13 ENCOUNTER — Ambulatory Visit (INDEPENDENT_AMBULATORY_CARE_PROVIDER_SITE_OTHER): Payer: 59 | Admitting: Obstetrics & Gynecology

## 2020-07-13 ENCOUNTER — Other Ambulatory Visit: Payer: Self-pay | Admitting: Obstetrics & Gynecology

## 2020-07-13 ENCOUNTER — Other Ambulatory Visit: Payer: Self-pay

## 2020-07-13 ENCOUNTER — Encounter: Payer: Self-pay | Admitting: Obstetrics & Gynecology

## 2020-07-13 VITALS — BP 131/81 | HR 77 | Resp 16 | Ht 65.0 in | Wt 168.0 lb

## 2020-07-13 DIAGNOSIS — N83202 Unspecified ovarian cyst, left side: Secondary | ICD-10-CM | POA: Diagnosis not present

## 2020-07-13 DIAGNOSIS — R928 Other abnormal and inconclusive findings on diagnostic imaging of breast: Secondary | ICD-10-CM

## 2020-07-13 DIAGNOSIS — N870 Mild cervical dysplasia: Secondary | ICD-10-CM | POA: Diagnosis not present

## 2020-07-13 DIAGNOSIS — Z01419 Encounter for gynecological examination (general) (routine) without abnormal findings: Secondary | ICD-10-CM

## 2020-07-13 NOTE — Progress Notes (Signed)
   Subjective:    Patient ID: Frances Taylor, female    DOB: Jul 08, 1960, 60 y.o.   MRN: [id number]  HPI  60 yo female presents for discussion of [place] results.  Pt hs stalbe ovarian cyst since 2020 and nml CA 125.  Pt also followed for dysplasia.  Pt has no abd or pelvic pain  CLINICAL DATA:  Ovarian cyst, follow-up, postmenopausal   EXAM: TRANSABDOMINAL AND TRANSVAGINAL ULTRASOUND OF PELVIS   TECHNIQUE: Both transabdominal and transvaginal ultrasound examinations of the pelvis were performed. Transabdominal technique was performed for global imaging of the pelvis including uterus, ovaries, adnexal regions, and pelvic cul-de-sac. It was necessary to proceed with endovaginal exam following the transabdominal exam to visualize the endometrium and ovaries.   COMPARISON:  06/28/2019   FINDINGS: Uterus   Measurements: 6.5 x 3.2 x 4.0 cm = volume: 43 mL. Anteverted. 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 No additional masses.   Endometrium   Thickness: 3 mm.  No endometrial fluid or focal abnormality   Not visualized, likely obscured by bowel   Left ovary   Measurements: 3.9 x 2.3 x 2.8 cm = volume: 13 mL. Simple cyst LEFT ovary 4.0 x 2.1 x 2.7 cm, little changed in size from the 3.6 x 1.9 x 3.0 cm on the previous study; recommend followup US in 6 months. note: This recommendation does not apply to premenarchal patients or to those with increased risk (genetic, family history, elevated tumor markers or other high-risk factors) of ovarian cancer. Reference: Radiology 2019 Nov; 293(2):359-371.   Other findings   No free pelvic fluid.  No additional adnexal masses.   IMPRESSION: Little change in size of a simple cyst in the LEFT ovary 4.0 cm greatest diameter.   This previously measured 3.6 cm diameter on 06/28/2019 and 4.2 cm diameter 07/03/2018.   Additional follow-up imaging in 6 months recommended to  demonstrate continued stability.     Electronically Signed   By: Lavonia Dana M.D.   On: 07/06/2020 10:48   Review of Systems  Constitutional: Negative.   Respiratory: Negative.    Cardiovascular: Negative.   Gastrointestinal: Negative.   Genitourinary: Negative.       Objective:   Physical Exam Vitals reviewed.  Constitutional:      General: She is not in acute distress.    Appearance: She is well-developed.  HENT:     Head: Normocephalic and atraumatic.  Eyes:     Conjunctiva/sclera: Conjunctivae normal.  Cardiovascular:     Rate and Rhythm: Normal rate.  Pulmonary:     Effort: Pulmonary effort is normal.  Skin:    General: Skin is warm and dry.  Neurological:     Mental Status: She is alert and oriented to person, place, and time.  Psychiatric:        Mood and Affect: Mood normal.  Vitals:   07/13/20 1240  BP: 131/81  Pulse: 77  Resp: 16  Weight: 168 lb (76.2 kg)  Height: 5\' 5"  (1.651 m)   Assessment & Plan:  60 yo female with stable ovarain cyst.  Short term follow up in December Pap smear in October Mammogram--pt wants to move back to [facility].  25 minutes spent with patient during exam, review of records, counseling and documentation.

## 2020-07-20 ENCOUNTER — Other Ambulatory Visit: Payer: 59

## 2020-07-30 ENCOUNTER — Encounter: Payer: Self-pay | Admitting: Family Medicine

## 2020-07-30 ENCOUNTER — Other Ambulatory Visit: Payer: Self-pay

## 2020-07-30 ENCOUNTER — Ambulatory Visit (INDEPENDENT_AMBULATORY_CARE_PROVIDER_SITE_OTHER): Payer: 59 | Admitting: Family Medicine

## 2020-07-30 VITALS — BP 145/78 | HR 62 | Temp 97.4°F | Resp 17

## 2020-07-30 DIAGNOSIS — J0141 Acute recurrent pansinusitis: Secondary | ICD-10-CM | POA: Diagnosis not present

## 2020-07-30 DIAGNOSIS — J309 Allergic rhinitis, unspecified: Secondary | ICD-10-CM | POA: Diagnosis not present

## 2020-07-30 MED ORDER — FLUTICASONE PROPIONATE 50 MCG/ACT NA SUSP
2.0000 | Freq: Every day | NASAL | 6 refills | Status: DC
Start: 2020-07-30 — End: 2021-10-05

## 2020-07-30 MED ORDER — SALINE NASAL SPRAY 0.65 % NA SOLN: 1.0000 | NASAL | 3 refills | Status: DC | PRN

## 2020-07-30 MED ORDER — DOXYCYCLINE HYCLATE 100 MG PO TABS: 100.0000 mg | ORAL_TABLET | Freq: Two times a day (BID) | ORAL | 0 refills | Status: AC

## 2020-07-30 MED ORDER — LEVOCETIRIZINE DIHYDROCHLORIDE 5 MG PO TABS: 5.0000 mg | ORAL_TABLET | Freq: Every evening | ORAL | 0 refills | Status: AC

## 2020-07-30 MED ORDER — PREDNISONE 20 MG PO TABS: 40.0000 mg | ORAL_TABLET | Freq: Every day | ORAL | 0 refills | Status: AC

## 2020-07-30 NOTE — Progress Notes (Signed)
Acute Office Visit  Subjective:    Patient ID: Frances Taylor, female    DOB: 22-Sep-1960, 60 y.o.   MRN: [id number]  Chief Complaint  Patient presents with   Sinusitis    HPI Patient is in today for sinusitis.  Patient states she has been having allergy and sinus problems all spring.  She moved in with her son several months ago and reports the house is very dry with a window AC units that she believes is triggering her symptoms.  She saw Samuel Bouche, FNP in March for sinus infection and was treated with Augmentin.  States she improved somewhat but the past few months have progressively worsened.  She is reporting nasal congestion with green mucus, nosebleeds, itchy watery eyes, chills, headaches, frontal and maxillary sinus pressure 7 out of 10, sneezing, fatigue.  She denies any fevers, ear pain or pressure, shortness of breath, chest pain, GI symptoms, loss of taste or smell.  She has been taking Zyrtec daily which is not helping.  Has also been using occasional Tylenol and ibuprofen for ongoing sinus pressure and headaches.      Past Medical History:  Diagnosis Date   Abnormal Pap smear of cervix    Cervical high risk HPV (human papillomavirus) test positive    GERD (gastroesophageal reflux disease)    Headache    Hypertension    took herself off medication, lifestyle    Past Surgical History:  Procedure Laterality Date   IRRIGATION AND DEBRIDEMENT SEBACEOUS CYST     scalp    Family History  Problem Relation Age of Onset   Cancer Father        Pituitary   Diabetes Paternal Grandmother    Heart attack Paternal Grandfather    Breast cancer Paternal Aunt    Prostate cancer Paternal Uncle     Social History   Socioeconomic History   Marital status: Single    Spouse name: Not on file   Number of children: Not on file   Years of education: Not on file   Highest education level: Not on file  Occupational History   Not on file  Tobacco Use   Smoking status:  Never   Smokeless tobacco: Never  Vaping Use   Vaping Use: Never used  Substance and Sexual Activity   Alcohol use: No   Drug use: No   Sexual activity: Not Currently    Birth control/protection: None, Post-menopausal  Other Topics Concern   Not on file  Social History Narrative   Not on file   Social Determinants of Health   Financial Resource Strain: Not on file  Food Insecurity: Not on file  Transportation Needs: Not on file  Physical Activity: Not on file  Stress: Not on file  Social Connections: Not on file  Intimate Partner Violence: Not on file    No outpatient medications prior to visit.   No facility-administered medications prior to visit.    No Known Allergies  Review of Systems All review of systems negative except what is listed in the HPI     Objective:    Physical Exam Vitals reviewed.  Constitutional:      Appearance: Normal appearance.  HENT:     Head: Normocephalic and atraumatic.     Comments: Tenderness over frontal and maxillary sinuses    Right Ear: Tympanic membrane normal.     Left Ear: Tympanic membrane normal.     Nose: Congestion present.     Mouth/Throat:  Mouth: Mucous membranes are moist.     Pharynx: Oropharynx is clear.  Eyes:     Extraocular Movements: Extraocular movements intact.     Pupils: Pupils are equal, round, and reactive to light.  Cardiovascular:     Rate and Rhythm: Normal rate and regular rhythm.     Heart sounds: Normal heart sounds.  Pulmonary:     Effort: Pulmonary effort is normal.     Breath sounds: Normal breath sounds.  Musculoskeletal:     Cervical back: Normal range of motion and neck supple.  Lymphadenopathy:     Cervical: No cervical adenopathy.  Skin:    General: Skin is warm and dry.     Capillary Refill: Capillary refill takes less than 2 seconds.     Findings: No rash.  Neurological:     General: No focal deficit present.     Mental Status: She is alert and oriented to person, place,  and time.  Psychiatric:        Mood and Affect: Mood normal.        Behavior: Behavior normal.        Thought Content: Thought content normal.        Judgment: Judgment normal.    BP (!) 145/78   Pulse 62   Temp (!) 97.4 F (36.3 C)   Resp 17   SpO2 96%  Wt Readings from Last 3 Encounters:  07/13/20 168 lb (76.2 kg)  05/29/20 169 lb (76.7 kg)  04/01/20 174 lb 12.8 oz (79.3 kg)    Health Maintenance Due  Topic Date Due   Pneumococcal Vaccine 97-50 Years old (1 - PCV) Never done   Zoster Vaccines- Shingrix (1 of 2) Never done   MAMMOGRAM  08/24/2017   COVID-19 Vaccine (4 - Booster) 05/05/2020    There are no preventive care reminders to display for this patient.   Lab Results  Component Value Date   TSH 2.32 05/29/2020   Lab Results  Component Value Date   WBC 5.6 05/29/2020   HGB 12.7 05/29/2020   HCT 40.3 05/29/2020   MCV 80.8 05/29/2020   PLT 267 05/29/2020   Lab Results  Component Value Date   NA 142 05/29/2020   K 4.0 05/29/2020   CO2 31 05/29/2020   GLUCOSE 87 05/29/2020   BUN 9 05/29/2020   CREATININE 1.03 05/29/2020   BILITOT 0.2 05/29/2020   ALKPHOS 81 05/12/2016   AST 21 05/29/2020   ALT 20 05/29/2020   PROT 6.4 05/29/2020   ALBUMIN 4.0 05/12/2016   CALCIUM 9.4 05/29/2020   No results found for: CHOL No results found for: HDL No results found for: LDLCALC No results found for: TRIG No results found for: CHOLHDL No results found for: HGBA1C     Assessment & Plan:   1. Acute recurrent pansinusitis 2. Allergic rhinitis, unspecified seasonality, unspecified trigger  Given duration of symptoms, will go ahead and treat with doxycycline as she did not get full improvement from Augmentin.  Also adding a prednisone burst due to significance of her symptoms.  Would also like to get her on a good allergy regimen as this could be triggering the sinus infections.  Adding Flonase, saline nasal spray, Xyzal.  Recommend she continue with rest, hydration,  humidifier use, warm compresses, over-the-counter analgesics. Patient aware of signs/symptoms requiring further/urgent evaluation.   - doxycycline (VIBRA-TABS) 100 MG tablet; Take 1 tablet (100 mg total) by mouth 2 (two) times daily for 7 days.  Dispense: 14  tablet; Refill: 0 - fluticasone (FLONASE) 50 MCG/ACT nasal spray; Place 2 sprays into both nostrils daily.  Dispense: 16 g; Refill: 6 - sodium chloride (OCEAN) 0.65 % nasal spray; Place 1 spray into the nose as needed for congestion.  Dispense: 15 mL; Refill: 3 - predniSONE (DELTASONE) 20 MG tablet; Take 2 tablets (40 mg total) by mouth daily with breakfast for 5 days.  Dispense: 10 tablet; Refill: 0 - levocetirizine (XYZAL) 5 MG tablet; Take 1 tablet (5 mg total) by mouth every evening.  Dispense: 30 tablet; Refill: 0    Follow-up if symptoms worsen or fail to improve.   Purcell Nails Olevia Bowens, DNP, FNP-C

## 2020-10-12 ENCOUNTER — Encounter: Payer: Self-pay | Admitting: Family Medicine

## 2020-10-12 ENCOUNTER — Ambulatory Visit (INDEPENDENT_AMBULATORY_CARE_PROVIDER_SITE_OTHER): Payer: 59 | Admitting: Family Medicine

## 2020-10-12 ENCOUNTER — Other Ambulatory Visit: Payer: Self-pay

## 2020-10-12 ENCOUNTER — Other Ambulatory Visit (HOSPITAL_COMMUNITY)
Admission: RE | Admit: 2020-10-12 | Discharge: 2020-10-12 | Disposition: A | Discharge status: Home / Self Care | Payer: 59 | Source: Ambulatory Visit | Attending: Obstetrics & Gynecology | Admitting: Obstetrics & Gynecology

## 2020-10-12 VITALS — BP 128/77 | HR 75 | Resp 16 | Ht 65.0 in | Wt 164.0 lb

## 2020-10-12 DIAGNOSIS — N870 Mild cervical dysplasia: Secondary | ICD-10-CM

## 2020-10-12 DIAGNOSIS — Z1231 Encounter for screening mammogram for malignant neoplasm of breast: Secondary | ICD-10-CM | POA: Diagnosis not present

## 2020-10-12 DIAGNOSIS — Z23 Encounter for immunization: Secondary | ICD-10-CM | POA: Diagnosis not present

## 2020-10-12 NOTE — Assessment & Plan Note (Signed)
S/p repeat pap today.

## 2020-10-12 NOTE — Progress Notes (Signed)
   Subjective:    Patient ID: Frances Taylor is a 60 y.o. female presenting with Pap only  on 10/12/2020  HPI: Here today for Repeat Pap smear. Prior LEEP for CIN1 with positive margins 09/2019. Also, with prior abnormal pap and wants to not return to [place], but stay in [facility].  Review of Systems  Constitutional:  Negative for chills and fever.  Respiratory:  Negative for shortness of breath.   Cardiovascular:  Negative for chest pain.  Gastrointestinal:  Negative for abdominal pain, nausea and vomiting.  Genitourinary:  Negative for dysuria.  Skin:  Negative for rash.     Objective:    BP 128/77   Pulse 75   Resp 16   Ht 5\' 5"  (1.651 m)   Wt 164 lb (74.4 kg)   BMI 27.29 kg/m  Physical Exam Constitutional:      General: She is not in acute distress.    Appearance: She is well-developed.  HENT:     Head: Normocephalic and atraumatic.  Eyes:     General: No scleral icterus. Cardiovascular:     Rate and Rhythm: Normal rate.  Pulmonary:     Effort: Pulmonary effort is normal.  Abdominal:     Palpations: Abdomen is soft.  Musculoskeletal:     Cervical back: Neck supple.  Skin:    General: Skin is warm and dry.  Neurological:     Mental Status: She is alert and oriented to person, place, and time.        Assessment & Plan:   Problem List Items Addressed This Visit       Unprioritized   Dysplasia of cervix, low grade (CIN 1)    S/p repeat pap today.      Relevant Orders   Cytology - PAP( [place])   Other Visit Diagnoses     Encounter for screening mammogram for malignant neoplasm of breast    -  Primary   Relevant Orders   MM Digital Screening       Return in about 1 year (around 10/12/2021) for a CPE.  Donnamae Jude 10/12/2020 8:36 AM

## 2020-10-12 NOTE — Addendum Note (Signed)
Addended by: Asencion Islam on: 10/12/2020 08:46 AM   Modules accepted: Orders

## 2020-10-14 LAB — CYTOLOGY - PAP
Comment: NEGATIVE
Diagnosis: NEGATIVE
High risk HPV: NEGATIVE

## 2020-12-14 ENCOUNTER — Other Ambulatory Visit: Payer: 59

## 2020-12-23 ENCOUNTER — Ambulatory Visit: Payer: 59

## 2021-01-28 ENCOUNTER — Other Ambulatory Visit: Payer: 59

## 2021-03-04 ENCOUNTER — Other Ambulatory Visit: Payer: 59

## 2021-03-12 ENCOUNTER — Encounter: Payer: Self-pay | Admitting: Physician Assistant

## 2021-03-12 ENCOUNTER — Ambulatory Visit (INDEPENDENT_AMBULATORY_CARE_PROVIDER_SITE_OTHER): Payer: 59 | Admitting: Physician Assistant

## 2021-03-12 ENCOUNTER — Other Ambulatory Visit: Payer: Self-pay

## 2021-03-12 VITALS — Temp 97.9°F | Ht 65.0 in | Wt 160.0 lb

## 2021-03-12 DIAGNOSIS — R112 Nausea with vomiting, unspecified: Secondary | ICD-10-CM

## 2021-03-12 DIAGNOSIS — J0100 Acute maxillary sinusitis, unspecified: Secondary | ICD-10-CM | POA: Diagnosis not present

## 2021-03-12 DIAGNOSIS — T148XXA Other injury of unspecified body region, initial encounter: Secondary | ICD-10-CM | POA: Insufficient documentation

## 2021-03-12 DIAGNOSIS — R195 Other fecal abnormalities: Secondary | ICD-10-CM

## 2021-03-12 DIAGNOSIS — R110 Nausea: Secondary | ICD-10-CM | POA: Insufficient documentation

## 2021-03-12 LAB — POCT INFLUENZA A/B
Influenza A, POC: NEGATIVE
Influenza B, POC: NEGATIVE

## 2021-03-12 MED ORDER — AMOXICILLIN-POT CLAVULANATE 875-125 MG PO TABS: 1.0000 | ORAL_TABLET | Freq: Two times a day (BID) | ORAL | 0 refills | Status: AC

## 2021-03-12 NOTE — Progress Notes (Signed)
Subjective:    Patient ID: Frances Taylor, female    DOB: [birth year], 61 y.o.   MRN: [id number]  HPI Pt is a 61 yo female with hx of vertigo and sinusitis who presents to the clinic with sinus pressure and congestion ongoing for a week or so with nausea and loose stools that started Wednesday. She is staying with her son while her house is being built. She is using humidifer but air feels dry there. She is blowing out blood clots and plegm a lot. She does have a sT. She vomiting a few times on Wednesday but not since. No fever. Not checked for covid. No sick contacts. She did have one episode of vertigo.   Reports a bruise on left tricep that cowork noticed. Not noticed any more or witness how she got it.  .. Active Ambulatory Problems    Diagnosis Date Noted   BPPV (benign paroxysmal positional vertigo) 04/13/2016   Gastroesophageal reflux disease 04/25/2016   Postmenopausal atrophic vaginitis 04/27/2016   Vasomotor flushing 06/13/2016   Right lumbar radiculitis 08/02/2016   Anterolisthesis 08/02/2016   Radiculitis of right cervical region 08/30/2016   Dysplasia of cervix, low grade (CIN 1) 06/28/2018   Left ovarian cyst 07/25/2019   Loose stools 03/12/2021   Nausea 03/12/2021   Bruising 03/12/2021   Resolved Ambulatory Problems    Diagnosis Date Noted   Premature menopause 06/13/2016   Lumbar facet arthropathy 08/02/2016   Impingement syndrome of right shoulder 08/19/2016   Past Medical History:  Diagnosis Date   Abnormal Pap smear of cervix    Cervical high risk HPV (human papillomavirus) test positive    GERD (gastroesophageal reflux disease)    Headache    Hypertension        Review of Systems See HPI>    Objective:   Physical Exam Vitals reviewed.  Constitutional:      Appearance: Normal appearance. She is obese.  HENT:     Head: Normocephalic.     Right Ear: Tympanic membrane normal.     Left Ear: Tympanic membrane normal.  Cardiovascular:     Rate  and Rhythm: Normal rate and regular rhythm.     Pulses: Normal pulses.     Heart sounds: Normal heart sounds.  Pulmonary:     Effort: Pulmonary effort is normal.     Breath sounds: Normal breath sounds.  Musculoskeletal:     Right lower leg: No edema.     Left lower leg: No edema.  Neurological:     General: No focal deficit present.     Mental Status: She is alert and oriented to person, place, and time.  Psychiatric:        Mood and Affect: Mood normal.      ..Orthostatic VS for the past 24 hrs (Last 3 readings):  BP- Lying Pulse- Lying BP- Sitting Pulse- Sitting BP- Standing at 0 minutes Pulse- Standing at 0 minutes  03/12/21 0901 121/80 79 128/89 65 121/82 80       Assessment & Plan:   Marland KitchenMarland KitchenAndrinette was seen today for dizziness and sinus problem.  Diagnoses and all orders for this visit:  Acute non-recurrent maxillary sinusitis -     POCT Influenza A/B -     Novel Coronavirus, NAA ([facility])  Loose stools -     CBC with Differential/Platelet -     COMPLETE METABOLIC PANEL WITH GFR -     POCT Influenza A/B -     Novel Coronavirus, NAA ([facility])  Nausea and vomiting, unspecified vomiting type -     CBC with Differential/Platelet -     COMPLETE METABOLIC PANEL WITH GFR -     POCT Influenza A/B -     Novel Coronavirus, NAA ([facility])  Bruising  Other orders -     amoxicillin-clavulanate (AUGMENTIN) 875-125 MG tablet; Take 1 tablet by mouth 2 (two) times daily for 10 days.   Will test for covid/flu pt certainly having signs and symptoms of sinusitis which she has history of. Sent augmentin Discussed symptomatic care Flu was negative Covid pending Cbc and cmp ordered BRAT diet for loose stools Stay hydrated. Immodium if needed.

## 2021-03-12 NOTE — Patient Instructions (Signed)

## 2021-03-13 ENCOUNTER — Encounter: Payer: Self-pay | Admitting: Physician Assistant

## 2021-03-13 ENCOUNTER — Encounter: Payer: Self-pay | Admitting: Medical-Surgical

## 2021-03-13 LAB — COMPLETE METABOLIC PANEL WITH GFR
AG Ratio: 1.3 (calc) (ref 1.0–2.5)
ALT: 20 U/L (ref 6–29)
AST: 21 U/L (ref 10–35)
Albumin: 4 g/dL (ref 3.6–5.1)
Alkaline phosphatase (APISO): 112 U/L (ref 37–153)
BUN/Creatinine Ratio: 11 (calc) (ref 6–22)
BUN: 12 mg/dL (ref 7–25)
CO2: 28 mmol/L (ref 20–32)
Calcium: 9.4 mg/dL (ref 8.6–10.4)
Chloride: 103 mmol/L (ref 98–110)
Creat: 1.1 mg/dL — ABNORMAL HIGH (ref 0.50–1.03)
Globulin: 3 g/dL (calc) (ref 1.9–3.7)
Glucose, Bld: 107 mg/dL — ABNORMAL HIGH (ref 65–99)
Potassium: 4.2 mmol/L (ref 3.5–5.3)
Sodium: 140 mmol/L (ref 135–146)
Total Bilirubin: 0.5 mg/dL (ref 0.2–1.2)
Total Protein: 7 g/dL (ref 6.1–8.1)
eGFR: 58 mL/min/{1.73_m2} — ABNORMAL LOW (ref >=60)

## 2021-03-13 LAB — CBC WITH DIFFERENTIAL/PLATELET
Absolute Monocytes: 530 cells/uL (ref 200–950)
Basophils Absolute: 21 cells/uL (ref 0–200)
Basophils Relative: 0.4 %
Eosinophils Absolute: 161 cells/uL (ref 15–500)
Eosinophils Relative: 3.1 %
HCT: 45 % (ref 35.0–45.0)
Hemoglobin: 14.4 g/dL (ref 11.7–15.5)
Lymphs Abs: 1165 cells/uL (ref 850–3900)
MCH: 25.8 pg — ABNORMAL LOW (ref 27.0–33.0)
MCHC: 32 g/dL (ref 32.0–36.0)
MCV: 80.5 fL (ref 80.0–100.0)
MPV: 9.9 fL (ref 7.5–12.5)
Monocytes Relative: 10.2 %
Neutro Abs: 3323 cells/uL (ref 1500–7800)
Neutrophils Relative %: 63.9 %
Platelets: 259 10*3/uL (ref 140–400)
RBC: 5.59 10*6/uL — ABNORMAL HIGH (ref 3.80–5.10)
RDW: 12.3 % (ref 11.0–15.0)
Total Lymphocyte: 22.4 %
WBC: 5.2 10*3/uL (ref 3.8–10.8)

## 2021-03-13 LAB — NOVEL CORONAVIRUS, NAA: SARS-CoV-2, NAA: NOT DETECTED

## 2021-03-14 ENCOUNTER — Encounter: Payer: Self-pay | Admitting: Physician Assistant

## 2021-03-15 ENCOUNTER — Other Ambulatory Visit: Payer: Self-pay | Admitting: Neurology

## 2021-03-15 DIAGNOSIS — N289 Disorder of kidney and ureter, unspecified: Secondary | ICD-10-CM

## 2021-03-15 NOTE — Progress Notes (Signed)
Negative for covid.  WBC normal.  Hemoglobin normal.  Liver looks good.  Kidney function down some. Stay hydrated. Recheck in 2-3 weeks.

## 2021-04-27 ENCOUNTER — Ambulatory Visit
Admission: RE | Admit: 2021-04-27 | Discharge: 2021-04-27 | Disposition: A | Discharge status: Home / Self Care | Payer: 59 | Source: Ambulatory Visit | Attending: Obstetrics & Gynecology | Admitting: Obstetrics & Gynecology

## 2021-04-27 ENCOUNTER — Ambulatory Visit: Payer: 59

## 2021-04-27 DIAGNOSIS — R928 Other abnormal and inconclusive findings on diagnostic imaging of breast: Secondary | ICD-10-CM

## 2021-10-04 NOTE — Progress Notes (Unsigned)
   Complete physical exam  Patient: Frances Taylor   DOB: 1960-05-23   61 y.o. Female  MRN: [id number]  Subjective:    No chief complaint on file.   Frances Taylor is a 61 y.o. female who presents today for a complete physical exam. She reports consuming a {diet types:17450} diet. {types:19826} She generally feels {DESC; WELL/FAIRLY WELL/POORLY:18703}. She reports sleeping {DESC; WELL/FAIRLY WELL/POORLY:18703}. She {does/does not:200015} have additional problems to discuss today.    Most recent fall risk assessment:    04/11/2019    4:16 PM  [place] in the past year? 0  Follow up [place] evaluation completed     Most recent depression screenings:    05/29/2020    4:53 PM 04/11/2019    4:17 PM  PHQ 2/9 Scores  PHQ - 2 Score 3 0  PHQ- 9 Score 18 0    {VISON DENTAL STD PSA (Optional):27386}  {History (Optional):23778}  Patient Care Team: Samuel Bouche, NP as PCP - General (Nurse Practitioner) Guss Bunde, MD as Consulting Physician (Obstetrics and Gynecology)   Outpatient Medications Prior to Visit  Medication Sig   dextromethorphan-guaiFENesin (MUCINEX DM) 30-600 MG 12hr tablet Take 1 tablet by mouth 2 (two) times daily.   fluticasone (FLONASE) 50 MCG/ACT nasal spray Place 2 sprays into both nostrils daily.   No facility-administered medications prior to visit.   ROS    Objective:    There were no vitals taken for this visit. {Vitals History (Optional):23777}  Physical Exam   No results found for any visits on 10/05/21. {Show previous labs (optional):23779}    Assessment & Plan:    Routine Health Maintenance and Physical Exam  Immunization History  Administered Date(s) Administered   Influenza,inj,Quad PF,6+ Mos 10/12/2020   Influenza-Unspecified 01/26/2019   PFIZER(Purple Top)SARS-COV-2 Vaccination 03/20/2019, 03/27/2019, 02/05/2020   Tdap 04/11/2019    Health Maintenance  Topic Date Due   Zoster Vaccines- Shingrix (1 of 2) Never  done   COVID-19 Vaccine (4 - Pfizer risk series) 04/01/2020   INFLUENZA VACCINE  08/24/2021   MAMMOGRAM  04/28/2023   PAP SMEAR-Modifier  10/13/2023   COLONOSCOPY (Pts 45-84yr Insurance coverage will need to be confirmed)  08/24/2024   TETANUS/TDAP  04/10/2029   Hepatitis C Screening  Completed   HIV Screening  Completed   HPV VACCINES  Aged Out    Discussed health benefits of physical activity, and encouraged her to engage in regular exercise appropriate for her age and condition.  Problem List Items Addressed This Visit   None Visit Diagnoses     Annual physical exam    -  Primary      No follow-ups on file.     JSamuel Bouche NP

## 2021-10-05 ENCOUNTER — Encounter: Payer: Self-pay | Admitting: Medical-Surgical

## 2021-10-05 ENCOUNTER — Ambulatory Visit (INDEPENDENT_AMBULATORY_CARE_PROVIDER_SITE_OTHER): Payer: 59 | Admitting: Medical-Surgical

## 2021-10-05 VITALS — BP 124/85 | HR 61 | Resp 20 | Ht 65.0 in | Wt 161.4 lb

## 2021-10-05 DIAGNOSIS — Z8639 Personal history of other endocrine, nutritional and metabolic disease: Secondary | ICD-10-CM | POA: Diagnosis not present

## 2021-10-05 DIAGNOSIS — Z0000 Encounter for general adult medical examination without abnormal findings: Secondary | ICD-10-CM

## 2021-10-05 DIAGNOSIS — R7989 Other specified abnormal findings of blood chemistry: Secondary | ICD-10-CM

## 2021-10-05 DIAGNOSIS — Z23 Encounter for immunization: Secondary | ICD-10-CM

## 2021-10-05 DIAGNOSIS — Z1329 Encounter for screening for other suspected endocrine disorder: Secondary | ICD-10-CM

## 2021-10-05 DIAGNOSIS — K644 Residual hemorrhoidal skin tags: Secondary | ICD-10-CM

## 2021-10-05 DIAGNOSIS — Z131 Encounter for screening for diabetes mellitus: Secondary | ICD-10-CM

## 2021-10-05 MED ORDER — AZITHROMYCIN 250 MG PO TABS: ORAL_TABLET | ORAL | 0 refills | Status: AC

## 2021-10-06 ENCOUNTER — Encounter: Payer: Self-pay | Admitting: Medical-Surgical

## 2021-10-06 DIAGNOSIS — R7303 Prediabetes: Secondary | ICD-10-CM | POA: Insufficient documentation

## 2021-10-06 DIAGNOSIS — E559 Vitamin D deficiency, unspecified: Secondary | ICD-10-CM | POA: Insufficient documentation

## 2021-10-06 DIAGNOSIS — R7989 Other specified abnormal findings of blood chemistry: Secondary | ICD-10-CM | POA: Insufficient documentation

## 2021-10-07 LAB — CBC WITH DIFFERENTIAL/PLATELET
Absolute Monocytes: 390 cells/uL (ref 200–950)
Basophils Absolute: 48 cells/uL (ref 0–200)
Basophils Relative: 0.8 %
Eosinophils Absolute: 300 cells/uL (ref 15–500)
Eosinophils Relative: 5 %
HCT: 41.8 % (ref 35.0–45.0)
Hemoglobin: 13.4 g/dL (ref 11.7–15.5)
Lymphs Abs: 1824 cells/uL (ref 850–3900)
MCH: 26.3 pg — ABNORMAL LOW (ref 27.0–33.0)
MCHC: 32.1 g/dL (ref 32.0–36.0)
MCV: 82 fL (ref 80.0–100.0)
MPV: 9.1 fL (ref 7.5–12.5)
Monocytes Relative: 6.5 %
Neutro Abs: 3438 cells/uL (ref 1500–7800)
Neutrophils Relative %: 57.3 %
Platelets: 272 10*3/uL (ref 140–400)
RBC: 5.1 10*6/uL (ref 3.80–5.10)
RDW: 12.7 % (ref 11.0–15.0)
Total Lymphocyte: 30.4 %
WBC: 6 10*3/uL (ref 3.8–10.8)

## 2021-10-07 LAB — COMPLETE METABOLIC PANEL WITH GFR
AG Ratio: 1.7 (calc) (ref 1.0–2.5)
ALT: 17 U/L (ref 6–29)
AST: 21 U/L (ref 10–35)
Albumin: 4.1 g/dL (ref 3.6–5.1)
Alkaline phosphatase (APISO): 103 U/L (ref 37–153)
BUN: 10 mg/dL (ref 7–25)
CO2: 28 mmol/L (ref 20–32)
Calcium: 9.2 mg/dL (ref 8.6–10.4)
Chloride: 107 mmol/L (ref 98–110)
Creat: 0.98 mg/dL (ref 0.50–1.05)
Globulin: 2.4 g/dL (calc) (ref 1.9–3.7)
Glucose, Bld: 91 mg/dL (ref 65–99)
Potassium: 4.1 mmol/L (ref 3.5–5.3)
Sodium: 142 mmol/L (ref 135–146)
Total Bilirubin: 0.3 mg/dL (ref 0.2–1.2)
Total Protein: 6.5 g/dL (ref 6.1–8.1)
eGFR: 66 mL/min/{1.73_m2} (ref >=60)

## 2021-10-07 LAB — TEST AUTHORIZATION

## 2021-10-07 LAB — HEMOGLOBIN A1C
Hgb A1c MFr Bld: 5.7 % of total Hgb — ABNORMAL HIGH (ref <5.7)
Mean Plasma Glucose: 117 mg/dL
eAG (mmol/L): 6.5 mmol/L

## 2021-10-07 LAB — LIPID PANEL
Cholesterol: 146 mg/dL (ref <200)
HDL: 48 mg/dL — ABNORMAL LOW (ref >=50)
LDL Cholesterol (Calc): 75 mg/dL (calc)
Non-HDL Cholesterol (Calc): 98 mg/dL (calc) (ref <130)
Total CHOL/HDL Ratio: 3 (calc) (ref <5.0)
Triglycerides: 144 mg/dL (ref <150)

## 2021-10-07 LAB — TSH: TSH: 5.62 mIU/L — ABNORMAL HIGH (ref 0.40–4.50)

## 2021-10-07 LAB — VITAMIN D 25 HYDROXY (VIT D DEFICIENCY, FRACTURES): Vit D, 25-Hydroxy: 20 ng/mL — ABNORMAL LOW (ref 30–100)

## 2021-10-07 LAB — T4, FREE: Free T4: 1.2 ng/dL (ref 0.8–1.8)

## 2021-10-07 NOTE — Addendum Note (Signed)
Addended bySamuel Bouche on: 10/07/2021 08:13 AM   Modules accepted: Orders

## 2021-10-18 NOTE — Progress Notes (Unsigned)
   ANNUAL EXAM Patient name: Frances Taylor MRN [id number]  Date of birth: July 01, 1960 Chief Complaint:   No chief complaint on file.  History of Present Illness:   Frances Taylor is a 61 y.o. G2P2 female being seen today for a routine annual exam.   Current complaints: ***  No LMP recorded. Patient is postmenopausal.  Current birth control: ***  Last pap:  LEEP in 2021, margins negative.  09/2020: Pap wnl, HPV Neg  Last MXR: 04/4021 Review of Systems:   Pertinent items are noted in HPI Denies any headaches, blurred vision, fatigue, shortness of breath, chest pain, abdominal pain, abnormal vaginal discharge/itching/odor/irritation, problems with periods, bowel movements, urination, or intercourse unless otherwise stated above. *** Pertinent History Reviewed:  Reviewed past medical,surgical, social and family history.  Reviewed problem list, medications and allergies. Physical Assessment:  There were no vitals filed for this visit.There is no height or weight on file to calculate BMI.   Physical Examination:  General appearance - well appearing, and in no distress Mental status - alert, oriented to person, place, and time Psych:  She has a normal mood and affect Skin - warm and dry, normal color, no suspicious lesions noted Chest - effort normal Heart - normal rate  Breasts - breasts appear normal, no suspicious masses, no skin or nipple changes or axillary nodes Abdomen - soft, nontender, nondistended, no masses or organomegaly Pelvic -  VULVA: normal appearing vulva with no masses, tenderness or lesions  VAGINA: normal appearing vagina with normal color and discharge, no lesions  CERVIX: normal appearing cervix without discharge or lesions, no CMT UTERUS: uterus is felt to be normal size, shape, consistency and nontender  ADNEXA: No adnexal masses or tenderness noted. Extremities:  No swelling or varicosities noted  Chaperone present for exam  No results found  for this or any previous visit (from the past 24 hour(s)).  Assessment & Plan:  Diagnoses and all orders for this visit:  Encounter for well woman exam  Dysplasia of cervix, low grade (CIN 1)      No orders of the defined types were placed in this encounter.   Meds: No orders of the defined types were placed in this encounter.   Follow-up: No follow-ups on file.  Radene Gunning, MD 10/18/2021 1:39 PM

## 2021-10-21 ENCOUNTER — Other Ambulatory Visit (HOSPITAL_COMMUNITY)
Admission: RE | Admit: 2021-10-21 | Discharge: 2021-10-21 | Disposition: A | Discharge status: Home / Self Care | Payer: 59 | Source: Ambulatory Visit | Attending: Obstetrics and Gynecology | Admitting: Obstetrics and Gynecology

## 2021-10-21 ENCOUNTER — Ambulatory Visit (INDEPENDENT_AMBULATORY_CARE_PROVIDER_SITE_OTHER): Payer: 59 | Admitting: Obstetrics and Gynecology

## 2021-10-21 ENCOUNTER — Encounter: Payer: Self-pay | Admitting: Obstetrics and Gynecology

## 2021-10-21 VITALS — BP 138/80 | HR 76 | Resp 16 | Ht 65.0 in | Wt 158.0 lb

## 2021-10-21 DIAGNOSIS — N870 Mild cervical dysplasia: Secondary | ICD-10-CM

## 2021-10-21 DIAGNOSIS — N83202 Unspecified ovarian cyst, left side: Secondary | ICD-10-CM

## 2021-10-21 DIAGNOSIS — Z01419 Encounter for gynecological examination (general) (routine) without abnormal findings: Secondary | ICD-10-CM | POA: Diagnosis present

## 2021-10-28 LAB — CYTOLOGY - PAP
Comment: NEGATIVE
Diagnosis: NEGATIVE
High risk HPV: NEGATIVE

## 2021-11-23 ENCOUNTER — Other Ambulatory Visit: Payer: 59

## 2021-11-23 ENCOUNTER — Ambulatory Visit (INDEPENDENT_AMBULATORY_CARE_PROVIDER_SITE_OTHER): Payer: 59

## 2021-11-23 DIAGNOSIS — N83202 Unspecified ovarian cyst, left side: Secondary | ICD-10-CM

## 2022-02-22 ENCOUNTER — Encounter: Payer: Self-pay | Admitting: Sports Medicine

## 2022-02-22 ENCOUNTER — Ambulatory Visit (INDEPENDENT_AMBULATORY_CARE_PROVIDER_SITE_OTHER): Payer: 59 | Admitting: Sports Medicine

## 2022-02-22 VITALS — BP 144/83 | HR 89 | Ht 65.0 in

## 2022-02-22 DIAGNOSIS — L03011 Cellulitis of right finger: Secondary | ICD-10-CM

## 2022-02-22 MED ORDER — FLUCONAZOLE 200 MG PO TABS: ORAL_TABLET | ORAL | 0 refills | Status: DC

## 2022-02-22 MED ORDER — SULFAMETHOXAZOLE-TRIMETHOPRIM 800-160 MG PO TABS: 1.0000 | ORAL_TABLET | Freq: Two times a day (BID) | ORAL | 0 refills | Status: DC

## 2022-02-22 MED ORDER — HYDROCODONE-ACETAMINOPHEN 5-325 MG PO TABS: 1.0000 | ORAL_TABLET | Freq: Three times a day (TID) | ORAL | 0 refills | Status: DC | PRN

## 2022-02-22 NOTE — Progress Notes (Signed)
    Procedures performed today:    Procedure: Incision and drainage of right index finger paronychia Risks, benefits, and alternatives explained and consent obtained. Time out conducted. Surface cleaned with alcohol and chlorhexidine. 5 cc lidocaine infiltrated in a digital block. Adequate anesthesia ensured. Area prepped and draped in a sterile fashion. #11 blade used to make a stab incision into abscess, I made the incision from the corner of the proximal nail fold, radial aspect, proximally about a centimeter. Pus expressed with pressure. Culture obtained. Hemostasis achieved. Pt stable. Aftercare and follow-up advised.  Independent interpretation of notes and tests performed by another provider:   None.  Brief History, Exam, Impression, and Recommendations:    Paronychia of right index finger Paronychia right index finger, swelling, erythema, was treated twice in urgent care with antibiotics, doxycycline and Keflex without sufficient improvement. Today we did an I&D under digital block. Cultures obtained, adding Septra and Diflucan for fungal coverage considering failure of a couple of antibiotics.    ____________________________________________ Gwen Her. Dianah Field, M.D., ABFM., CAQSM., AME. Primary Care and Sports Medicine [facility]  Adjunct Professor of [employer] Medicine  Risk manager

## 2022-02-22 NOTE — Assessment & Plan Note (Signed)
Paronychia right index finger, swelling, erythema, was treated twice in urgent care with antibiotics, doxycycline and Keflex without sufficient improvement. Today we did an I&D under digital block. Cultures obtained, adding Septra and Diflucan for fungal coverage considering failure of a couple of antibiotics.

## 2022-02-22 NOTE — Patient Instructions (Signed)
Incision and Drainage, Care After This sheet gives you information about how to care for yourself after your procedure. Your health care provider may also give you more specific instructions. If you have problems or questions, contact your health care provider. What can I expect after the procedure? After the procedure, it is common to have: Pain or discomfort around the incision site. Blood, fluid, or pus (drainage) from the incision. Redness and firm skin around the incision site. Follow these instructions at home: Medicines Take over-the-counter and prescription medicines only as told by your health care provider. If you were prescribed an antibiotic medicine, use or take it as told by your health care provider. Do not stop using the antibiotic even if you start to feel better. Wound care Follow instructions from your health care provider about how to take care of your wound. Make sure you: Wash your hands with soap and water before and after you change your bandage (dressing). If soap and water are not available, use hand sanitizer. Change your dressing and packing as told by your health care provider. If your dressing is dry or stuck when you try to remove it, moisten or wet the dressing with saline or water so that it can be removed without harming your skin or tissues. If your wound is packed, leave it in place until your health care provider tells you to remove it. To remove the packing, moisten or wet the packing with saline or water so that it can be removed without harming your skin or tissues. Leave stitches (sutures), skin glue, or adhesive strips in place. These skin closures may need to stay in place for 2 weeks or longer. If adhesive strip edges start to loosen and curl up, you may trim the loose edges. Do not remove adhesive strips completely unless your health care provider tells you to do that. Check your wound every day for signs of infection. Check for: More redness, swelling,  or pain. More fluid or blood. Warmth. Pus or a bad smell. If you were sent home with a drain tube in place, follow instructions from your health care provider about: How to empty it. How to care for it at home.  General instructions Rest the affected area. Do not take baths, swim, or use a hot tub until your health care provider approves. Ask your health care provider if you may take showers. You may only be allowed to take sponge baths. Return to your normal activities as told by your health care provider. Ask your health care provider what activities are safe for you. Your health care provider may put you on activity or lifting restrictions. The incision will continue to drain. It is normal to have some clear or slightly bloody drainage. The amount of drainage should lessen each day. Do not apply any creams, ointments, or liquids unless you have been told to by your health care provider. Keep all follow-up visits as told by your health care provider. This is important. Contact a health care provider if: Your cyst or abscess returns. You have more redness, swelling, or pain around your incision. You have more fluid or blood coming from your incision. Your incision feels warm to the touch. You have pus or a bad smell coming from your incision. You have red streaks above or below the incision site. Get help right away if: You have severe pain or bleeding. You cannot eat or drink without vomiting. You have a fever or chills. You have redness that spreads  quickly. You have decreased urine output. You become short of breath. You have chest pain. You cough up blood. The affected area becomes numb or starts to tingle. These symptoms may represent a serious problem that is an emergency. Do not wait to see if the symptoms will go away. Get medical help right away. Call your local emergency services (911 in the [place] Do not drive yourself to the hospital. Summary After this procedure, it is  common to have fluid, blood, or pus coming from the surgery site. Follow all home care instructions. You will be told how to take care of your incision, how to check for infection, and how to take medicines. If you were prescribed an antibiotic medicine, take it as told by your health care provider. Do not stop taking the antibiotic even if you start to feel better. Contact a health care provider if you have increased redness, swelling, or pain around your incision. Get help right away if you have chest pain, you vomit, you cough up blood, or you have shortness of breath. Keep all follow-up visits as told by your health care provider. This is important. This information is not intended to replace advice given to you by your health care provider. Make sure you discuss any questions you have with your health care provider. Document Revised: 04/15/2021 Document Reviewed: 10/22/2020 Elsevier Patient Education  [place].

## 2022-02-26 LAB — WOUND CULTURE
MICRO NUMBER:: 14493115
SPECIMEN QUALITY:: ADEQUATE

## 2022-02-28 ENCOUNTER — Encounter (INDEPENDENT_AMBULATORY_CARE_PROVIDER_SITE_OTHER): Payer: 59 | Admitting: Sports Medicine

## 2022-02-28 DIAGNOSIS — L03011 Cellulitis of right finger: Secondary | ICD-10-CM

## 2022-03-03 NOTE — Telephone Encounter (Signed)
I spent 5 total minutes of online digital evaluation and management services in this patient-initiated request for online care. 

## 2022-03-08 ENCOUNTER — Ambulatory Visit (INDEPENDENT_AMBULATORY_CARE_PROVIDER_SITE_OTHER): Payer: 59 | Admitting: Sports Medicine

## 2022-03-08 ENCOUNTER — Encounter: Payer: Self-pay | Admitting: Sports Medicine

## 2022-03-08 DIAGNOSIS — L03011 Cellulitis of right finger: Secondary | ICD-10-CM | POA: Diagnosis not present

## 2022-03-08 MED ORDER — LEVOFLOXACIN 750 MG PO TABS: 750.0000 mg | ORAL_TABLET | Freq: Every day | ORAL | 0 refills | Status: DC

## 2022-03-08 NOTE — Assessment & Plan Note (Signed)
[name] returns, we did an I&D of a fairly severe paronychia about 2 weeks ago. She had already failed doxycycline and Keflex in urgent care. I did I&D under digital block, we obtain cultures and added Septra as well as fluconazole to cover any potential fungal etiologies. She has improved dramatically today, there is a touch of erythema so I am going to add Levaquin which had a very good MIC to the bacteria that grew out. She can really return to see me as needed.

## 2022-03-08 NOTE — Progress Notes (Signed)
    Procedures performed today:    None.  Independent interpretation of notes and tests performed by another provider:   None.  Brief History, Exam, Impression, and Recommendations:    Paronychia of right index finger Frances Taylor returns, we did an I&D of a fairly severe paronychia about 2 weeks ago. She had already failed doxycycline and Keflex in urgent care. I did I&D under digital block, we obtain cultures and added Septra as well as fluconazole to cover any potential fungal etiologies. She has improved dramatically today, there is a touch of erythema so I am going to add Levaquin which had a very good MIC to the bacteria that grew out. She can really return to see me as needed.    ____________________________________________ Frances Her. Frances Taylor, M.D., ABFM., CAQSM., AME. Primary Care and Sports Medicine [facility]  Adjunct Professor of [place] [employer] of Medicine  Risk manager

## 2022-03-31 ENCOUNTER — Ambulatory Visit: Payer: 59 | Admitting: Sports Medicine

## 2022-07-08 ENCOUNTER — Ambulatory Visit (INDEPENDENT_AMBULATORY_CARE_PROVIDER_SITE_OTHER): Payer: 59 | Admitting: Sports Medicine

## 2022-07-08 ENCOUNTER — Other Ambulatory Visit: Payer: Self-pay | Admitting: Obstetrics & Gynecology

## 2022-07-08 DIAGNOSIS — Z1231 Encounter for screening mammogram for malignant neoplasm of breast: Secondary | ICD-10-CM

## 2022-07-08 DIAGNOSIS — L7211 Pilar cyst: Secondary | ICD-10-CM | POA: Diagnosis not present

## 2022-07-08 NOTE — Assessment & Plan Note (Signed)
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 I explained her this was benign, and unless there is a cosmetic issue or it was painful we typically would leave these alone, I have asked her to think long and hard as to whether she would want me to remove it or not, if she does decide she would like to do the procedure we would do it in a 30-minute slot.

## 2022-07-08 NOTE — Progress Notes (Signed)
    Procedures performed today:    None.  Independent interpretation of notes and tests performed by another provider:   None.  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 I explained her this was benign, and unless there is a cosmetic issue or it was painful we typically would leave these alone, I have asked her to think long and hard as to whether she would want me to remove it or not, if she does decide she would like to do the procedure we would do it in a 30-minute slot.    ____________________________________________ Ihor Austin. Benjamin Stain, M.D., ABFM., CAQSM., AME. Primary Care and Sports Medicine [facility]  Adjunct Professor of Family Medicine  [place] [employer]  Restaurant manager, fast food

## 2022-08-03 ENCOUNTER — Telehealth: Payer: Self-pay

## 2022-08-03 NOTE — Telephone Encounter (Signed)
Patient called about her work note, patient would like to get a letter written differently from previous letter dated 10/05/2021. Patient states that HR needs something with more clarification, when she is in figure 8 motion, is when her vertigo starts to pick back up, please advise, thanks.

## 2022-08-09 NOTE — Progress Notes (Signed)
        Established patient visit  History, exam, impression, and plan:  1. Benign paroxysmal positional vertigo, unspecified laterality Pleasant 62 year old female presenting today for reevaluation of BPPV.  She has been experiencing issues for quite a while with intermittent vertiginous symptoms.  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  Reports that she has approximately 2 episodes per month of symptoms including dizziness, nausea with vomiting, and headache.  During these times, she does take Tylenol which helps her headache however the dizziness is debilitating and she ends up laying down in a dark room waiting for it to resolve.  Symptoms usually last for 1 day.  Notes that she does not get enough sleep as she works 2 jobs and helps take care of her granddaughter.  Symptoms are provoked by circular movements but also have been provoked more recently with frequent head movements up/down and lateral rotation.  Has never taken meclizine to take symptoms.  At work, she is responsible for quality control although she is occasionally pulled to other activities.  We discussed recommendations for limitation of her activities at work to prevent provoking factors.  We did briefly review that her symptoms are vertiginous however with the presentation including headache, there is concern for vestibular migraine.  Discussed trialing a triptan to see if this would help with symptoms but she would like to hold off on this.  Work note addended as requested and information included to expect 1-3 days/month where she will be out of work due to her symptoms.  If further forms are required, have requested them be sent to our fax number for ASAP completion.  Meclizine sent to her pharmacy.   Procedures performed this visit: None.  Return  in about 3 months (around 11/10/2022) for annual physical exam.  __________________________________ Thayer Ohm, DNP, APRN, FNP-BC Primary Care and Sports Medicine [facility]

## 2022-08-10 ENCOUNTER — Ambulatory Visit (INDEPENDENT_AMBULATORY_CARE_PROVIDER_SITE_OTHER): Payer: 59 | Admitting: Medical-Surgical

## 2022-08-10 ENCOUNTER — Ambulatory Visit: Payer: 59

## 2022-08-10 ENCOUNTER — Encounter: Payer: Self-pay | Admitting: Medical-Surgical

## 2022-08-10 VITALS — BP 128/75 | HR 61 | Resp 20 | Ht 65.0 in | Wt 165.3 lb

## 2022-08-10 DIAGNOSIS — H8110 Benign paroxysmal vertigo, unspecified ear: Secondary | ICD-10-CM

## 2022-08-10 DIAGNOSIS — Z1231 Encounter for screening mammogram for malignant neoplasm of breast: Secondary | ICD-10-CM

## 2022-08-10 MED ORDER — MECLIZINE HCL 25 MG PO TABS: 25.0000 mg | ORAL_TABLET | Freq: Three times a day (TID) | ORAL | 0 refills | Status: DC | PRN

## 2022-10-07 ENCOUNTER — Telehealth: Payer: Self-pay

## 2022-10-07 ENCOUNTER — Encounter: Payer: 59 | Admitting: Medical-Surgical

## 2022-10-07 NOTE — Progress Notes (Unsigned)
Complete physical exam  Patient: Frances Taylor   DOB: 14-Apr-1960   62 y.o. Female  MRN: [id number]  Subjective:    No chief complaint on file.   Frances Taylor is a 62 y.o. female who presents today for a complete physical exam. She reports consuming a {diet types:17450} diet. {types:19826} She generally feels {DESC; WELL/FAIRLY WELL/POORLY:18703}. She reports sleeping {DESC; WELL/FAIRLY WELL/POORLY:18703}. She {does/does not:200015} have additional problems to discuss today.    Most recent fall risk assessment:    08/10/2022    9:12 AM  Fall Risk   Falls in the past year? 0  Number falls in past yr: 0  Injury with Fall? 0  Risk for fall due to : No Fall Risks  Follow up Falls evaluation completed     Most recent depression screenings:    08/10/2022    9:12 AM 10/05/2021    8:32 AM  PHQ 2/9 Scores  PHQ - 2 Score 0 0    {VISON DENTAL STD PSA (Optional):27386}  {History (Optional):23778}  Patient Care Team: Christen Butter, NP as PCP - General (Nurse Practitioner) Lesly Dukes, MD as Consulting Physician (Obstetrics and Gynecology)   Outpatient Medications Prior to Visit  Medication Sig   meclizine (ANTIVERT) 25 MG tablet Take 1 tablet (25 mg total) by mouth 3 (three) times daily as needed for dizziness.   Multiple Vitamin (MULTIVITAMIN) tablet Take 1 tablet by mouth daily.   No facility-administered medications prior to visit.    ROS        Objective:     There were no vitals taken for this visit. {Vitals History (Optional):23777}  Physical Exam   No results found for any visits on 10/07/22. {Show previous labs (optional):23779}    Assessment & Plan:    Routine Health Maintenance and Physical Exam  Immunization History  Administered Date(s) Administered   Influenza,inj,Quad PF,6+ Mos 10/12/2020   Influenza-Unspecified 01/26/2019   PFIZER(Purple Top)SARS-COV-2 Vaccination 03/20/2019, 03/27/2019, 02/05/2020   Tdap 04/11/2019     Health Maintenance  Topic Date Due   INFLUENZA VACCINE  08/25/2022   COVID-19 Vaccine (4 - 2023-24 season) 09/25/2022   Zoster Vaccines- Shingrix (1 of 2) 11/10/2022 (Originally 08/09/1980)   MAMMOGRAM  08/09/2024   Colonoscopy  08/24/2024   PAP SMEAR-Modifier  10/21/2024   DTaP/Tdap/Td (2 - Td or Tdap) 04/10/2029   Hepatitis C Screening  Completed   HIV Screening  Completed   HPV VACCINES  Aged Out    Discussed health benefits of physical activity, and encouraged her to engage in regular exercise appropriate for her age and condition.  Problem List Items Addressed This Visit     Elevated TSH   Essential hypertension - Primary (Chronic)   Prediabetes   Vitamin D insufficiency   Other Visit Diagnoses     Annual physical exam          No follow-ups on file.     Christen Butter, NP

## 2022-10-07 NOTE — Telephone Encounter (Signed)
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

## 2022-10-13 ENCOUNTER — Encounter: Payer: 59 | Admitting: Medical-Surgical

## 2022-11-01 NOTE — Progress Notes (Unsigned)
ANNUAL EXAM Patient name: Frances Taylor MRN [id number]  Date of birth: [birth year] Chief Complaint:   No chief complaint on file.  History of Present Illness:   Frances Taylor is a 62 y.o. G2P2 female being seen today for a routine annual exam.   Current complaints: None  No LMP recorded. Patient is postmenopausal.   Last pap:  LEEP in 2021, margins negative.  09/2020: Pap wnl, HPV Neg  Last MXR: 07/2022 Review of Systems:   Pertinent items are noted in HPI Denies any headaches, blurred vision, fatigue, shortness of breath, chest pain, abdominal pain, abnormal vaginal discharge/itching/odor/irritation, problems with periods, bowel movements, urination, or intercourse unless otherwise stated above.  Pertinent History Reviewed:  Reviewed past medical,surgical, social and family history.  Reviewed problem list, medications and allergies. Physical Assessment:   There were no vitals filed for this visit. There is no height or weight on file to calculate BMI.   Physical Examination:  General appearance - well appearing, and in no distress Mental status - alert, oriented to person, place, and time Psych:  She has a normal mood and affect Skin - warm and dry, normal color, no suspicious lesions noted Chest - effort normal Heart - normal rate  Breasts - breasts appear normal, no suspicious masses, no skin or nipple changes or axillary nodes Abdomen - soft, nontender, nondistended, no masses or organomegaly Pelvic -  VULVA: normal appearing vulva with no masses, tenderness or lesions  VAGINA: normal appearing vagina with normal color and discharge, no lesions  CERVIX: normal appearing cervix without discharge or lesions, no CMT. Cervix flush with the vagina.  UTERUS: uterus is felt to be normal size, shape, consistency and nontender  ADNEXA: No adnexal masses or tenderness noted. Extremities:  No swelling or varicosities noted  Chaperone present for exam  No results  found for this or any previous visit (from the past 24 hour(s)).  Assessment & Plan:  Frances Taylor was seen today for annual exam.  Diagnoses and all orders for this visit:  Encounter for well woman exam - Cervical cancer screening: Discussed guidelines. Pap with HPV wnl 09/2021 (2nd normal). Now on routine screening.  - Breast Health: Encouraged self breast awareness/SBE. Discussed limits of clinical breast exam for detecting breast cancer. Discussed importance of annual MXR. MXR is up to date: 04/2021 - Climacteric/Sexual health: Reviewed typical and atypical symptoms of menopause/peri-menopause. Discussed PMB and to call if any amount of spotting.  - Colonoscopy: up to date - F/U 12 months and prn -     Cytology - PAP( [place])  Dysplasia of cervix, low grade (CIN 1) Discussed if needed a LEEP again, I would recommend hysterectomy as cervix is flush with her vagina.  -     Cytology - PAP( [place])  Left ovarian cyst - 3.7 cm cyst in 2023 (reduced in size from prior US - had been 4 cm) - If growth would recommend removal of ovary. (Both ovaries in that case given her age) - Recheck [place] this year.    No orders of the defined types were placed in this encounter.   Meds: No orders of the defined types were placed in this encounter.   Follow-up: No follow-ups on file.  [name], MD 11/01/2022 4:45 PM

## 2022-11-02 ENCOUNTER — Ambulatory Visit (INDEPENDENT_AMBULATORY_CARE_PROVIDER_SITE_OTHER): Payer: 59 | Admitting: Obstetrics and Gynecology

## 2022-11-02 ENCOUNTER — Encounter: Payer: Self-pay | Admitting: Obstetrics and Gynecology

## 2022-11-02 VITALS — BP 135/82 | HR 73 | Resp 16 | Ht 65.5 in | Wt 162.0 lb

## 2022-11-02 DIAGNOSIS — N870 Mild cervical dysplasia: Secondary | ICD-10-CM

## 2022-11-02 DIAGNOSIS — N83202 Unspecified ovarian cyst, left side: Secondary | ICD-10-CM

## 2022-11-02 DIAGNOSIS — Z01419 Encounter for gynecological examination (general) (routine) without abnormal findings: Secondary | ICD-10-CM | POA: Diagnosis not present

## 2022-11-09 ENCOUNTER — Other Ambulatory Visit: Payer: 59

## 2022-11-09 ENCOUNTER — Ambulatory Visit: Payer: 59

## 2022-11-09 DIAGNOSIS — N83202 Unspecified ovarian cyst, left side: Secondary | ICD-10-CM

## 2022-11-10 ENCOUNTER — Other Ambulatory Visit: Payer: 59

## 2023-04-13 ENCOUNTER — Ambulatory Visit (INDEPENDENT_AMBULATORY_CARE_PROVIDER_SITE_OTHER): Admitting: Sports Medicine

## 2023-04-13 ENCOUNTER — Encounter: Payer: Self-pay | Admitting: Sports Medicine

## 2023-04-13 DIAGNOSIS — M7541 Impingement syndrome of right shoulder: Secondary | ICD-10-CM | POA: Diagnosis not present

## 2023-04-13 NOTE — Assessment & Plan Note (Signed)
 Increasing pain right shoulder over the deltoid worse with abduction, positive impingement signs on exam, no weakness per I explained the anatomy and force coupling function of the rotator cuff, adding x-rays and formal PT, return to see me in 6 weeks.

## 2023-04-13 NOTE — Progress Notes (Signed)
    Procedures performed today:    None.  Independent interpretation of notes and tests performed by another provider:   None.  Brief History, Exam, Impression, and Recommendations:    Impingement syndrome, shoulder, right Increasing pain right shoulder over the deltoid worse with abduction, positive impingement signs on exam, no weakness per I explained the anatomy and force coupling function of the rotator cuff, adding x-rays and formal PT, return to see me in 6 weeks.    ____________________________________________ Ihor Austin. Benjamin Stain, M.D., ABFM., CAQSM., AME. Primary Care and Sports Medicine [facility]  Adjunct Professor of Family Medicine  [place] [employer] [facility] [employer]  Restaurant manager, fast food

## 2023-04-20 ENCOUNTER — Ambulatory Visit (INDEPENDENT_AMBULATORY_CARE_PROVIDER_SITE_OTHER)

## 2023-04-20 ENCOUNTER — Other Ambulatory Visit: Payer: Self-pay

## 2023-04-20 ENCOUNTER — Ambulatory Visit: Attending: Sports Medicine

## 2023-04-20 DIAGNOSIS — M19011 Primary osteoarthritis, right shoulder: Secondary | ICD-10-CM

## 2023-04-20 DIAGNOSIS — M25611 Stiffness of right shoulder, not elsewhere classified: Secondary | ICD-10-CM | POA: Diagnosis present

## 2023-04-20 DIAGNOSIS — M7541 Impingement syndrome of right shoulder: Secondary | ICD-10-CM

## 2023-04-20 DIAGNOSIS — M25511 Pain in right shoulder: Secondary | ICD-10-CM | POA: Diagnosis present

## 2023-04-20 DIAGNOSIS — M542 Cervicalgia: Secondary | ICD-10-CM | POA: Insufficient documentation

## 2023-04-20 NOTE — Therapy (Signed)
 OUTPATIENT PHYSICAL THERAPY UPPER EXTREMITY EVALUATION   Patient Name: Frances Taylor MRN: [id number] DOB:May 23, 1960, 63 y.o., female Today's Date: 04/20/2023  END OF SESSION:   Past Medical History:  Diagnosis Date   Abnormal Pap smear of cervix    Cervical high risk HPV (human papillomavirus) test positive    GERD (gastroesophageal reflux disease)    Headache    Hypertension    took herself off medication, lifestyle   Past Surgical History:  Procedure Laterality Date   IRRIGATION AND DEBRIDEMENT SEBACEOUS CYST     scalp   Patient Active Problem List   Diagnosis Date Noted   Pilar cyst of scalp 07/08/2022   Paronychia of right index finger 02/22/2022   Prediabetes 10/06/2021   Vitamin D insufficiency 10/06/2021   Elevated TSH 10/06/2021   Left ovarian cyst 07/25/2019   Dysplasia of cervix, low grade (CIN 1) 06/28/2018   Radiculitis of right cervical region 08/30/2016   Impingement syndrome, shoulder, right 08/19/2016   Right lumbar radiculitis 08/02/2016   Anterolisthesis 08/02/2016   Gastroesophageal reflux disease 04/25/2016   BPPV (benign paroxysmal positional vertigo) 04/13/2016   Nasopharyngitis 08/19/2013   Trapezius muscle spasm 03/05/2013   Plantar fasciitis, bilateral 12/04/2012   Migraine 06/19/2012   Tension type headache 06/19/2012   Depression 12/13/2011   Helicobacter pylori gastritis 12/13/2011   Sebaceous cyst 06/16/2011   Essential hypertension 11/22/2010    PCP: Christen Butter, NP  REFERRING PROVIDER: Rodney Langton, MD  REFERRING DIAG:  M75.41 (ICD-10-CM) - Impingement syndrome, shoulder, right   THERAPY DIAG:  Impingement syndrome, shoulder, right - Plan: PT plan of care cert/re-cert  Cervicalgia - Plan: PT plan of care cert/re-cert  Right shoulder pain, unspecified chronicity - Plan: PT plan of care cert/re-cert  Stiffness of right shoulder, not elsewhere classified - Plan: PT plan of care cert/re-cert  Rationale for  Evaluation and Treatment: Rehabilitation  ONSET DATE: Referral date 04/13/2023, onset approximately 3-4 months ago  SUBJECTIVE:                                                                                                                                                                                      SUBJECTIVE STATEMENT: The patient reported that the R shoulder started to both a few months ago, sometime around December/January. It has not gotten worse overall since that time but there are periods when it is worse than others. She uses the R arm constantly at work, mostly moving/folding light items, but also has noticed that when she lifts something heavy the pain will cause her to drop it. She had radiographs performed earlier today - she saw Dr. Benjamin Stain as the first provider for  this issue and has not had any interventions to date. The patient has started using [employer] which helps. The R shoulder pain is felt in the bra strap region at the top of the shoulder, and the pain can radiate toward the R neck area. Patient denies any numbness/tingling in the R arm, but does note some soreness in the R carpal tunnel region.  Hand dominance: Right  PERTINENT HISTORY: No pertinent history per patient report  PAIN:  Are you having pain? Yes: NPRS scale: at rest 5/10 at R neck, 7/10 at worst at R shoulder Pain location: See above Pain description: See above Aggravating factors: See above Relieving factors: See above  PRECAUTIONS: None  RED FLAGS: None   WEIGHT BEARING RESTRICTIONS: No  FALLS:  Has patient fallen in last 6 months? No  PATIENT GOALS: Reduce pain, avoid surgery, be able to participate in work tasks  OBJECTIVE:  Note: Objective measures were completed at Evaluation unless otherwise noted.  DIAGNOSTIC FINDINGS:  R shoulder radiographs performed but waiting on results  PATIENT SURVEYS :  Patient-specific activity scoring scheme (Point to one number):  "0"  represents "unable to perform." "10" represents "able to perform at prior level.  Activity Eval     Work  4    Lifting arm overhead  4    Sleeping 4    Total score = sum of the activity scores/number of activities Minimum detectable change (90%CI) for average score = 2 points Minimum detectable change (90%CI) for single activity score = 3 points PSFS developed by: Natividad Brood, M., & Binkley, J. (1995). Assessing disability and change on individual  patients: a report of a patient specific measure. Physiotherapy [place], 47, [phone]. Reproduced with the permission of the authors  CERVICAL ROM:   Active ROM A/PROM (deg) eval  Flexion 50  Extension 30  Right lateral flexion 35  Left lateral flexion 25  Right rotation 65  Left rotation 70   (Blank rows = not tested)  UPPER EXTREMITY ROM:   Active ROM Right eval Left eval  Shoulder flexion 130 145  Shoulder extension 50 65  Shoulder abduction 140 150  Shoulder adduction    Shoulder Apley internal rotation T8 T8  Shoulder Apley external rotation T3 T3  Elbow flexion    Elbow extension    Wrist flexion    Wrist extension    Wrist ulnar deviation    Wrist radial deviation    Wrist pronation    Wrist supination    (Blank rows = not tested)  Passive shoulder internal rotation at 90 abduction: -Left: WNL -Right: 20 deg  UPPER EXTREMITY MMT:  MMT Right eval Left eval  Shoulder flexion 4 4  Shoulder extension 4 4+  Shoulder abduction 4 4  Shoulder adduction 4+ 5  Shoulder internal rotation 4+ 4+  Shoulder external rotation 3+ 4-  Middle trapezius    Lower trapezius    Elbow flexion    Elbow extension    Wrist flexion    Wrist extension    Wrist ulnar deviation    Wrist radial deviation    Wrist pronation    Wrist supination    Grip strength (lbs)    (Blank rows = not tested)  JOINT MOBILITY TESTING:  -R shoulder hypomobile AP glide -Cervical spine generally hypomobile with central  PA glides and rotational PIIVMs  PALPATION:  -Pain to palpation to the R upper trapezius (familiar pain) -Pain to palpation of the R cervical paraspinals -  Pain to palpation of the R periscapular region   [facility] Adult PT Treatment:                                                DATE: 04/20/2023 Therapeutic Exercise: Instructed home program (see below) Manual Therapy: Supine: Central PA glides to the cervical spine Soft tissue mobilization (pin and stretch) to the R upper trapezius  PATIENT EDUCATION:  Education details: on current presentation, on HEP, on clinical outcomes score and POC Person educated: Patient Education method: Explanation, Demonstration, and Handouts Education comprehension: verbalized understanding   HOME EXERCISE PROGRAM: Access Code: [email or web address] URL: [email or web address] Date: 04/20/2023 Prepared by: Edmonia Caprio  Program Notes Also added self soft tissue mobilization for the R upper trapezius using ball  Exercises - Seated Upper Trapezius Stretch  - 1 x daily - 7 x weekly - 4 sets - 30 hold  ASSESSMENT:  CLINICAL IMPRESSION: Patient is a 63 y.o. who was seen today for physical therapy evaluation and treatment for R shoulder pain. The R shoulder does have limited ROM compared to the L, with posterior shoulder tightness noted. Strength testing was painful for all directions at the R shoulder, which points potentially to a more proximal pain generator. The patient did have limitations in cervical spine ROM as well as pain with palpation of the R cervical paraspinals, the R upper trapezius, and the R periscapular musculature. Likely, this is a mixed presentation involving the cervical spine, R shoulder, and R scapulothoracic regions. Patient likely to benefit from physical therapy to address these impairments to reduce pain and restore function.    OBJECTIVE IMPAIRMENTS: decreased mobility, decreased ROM, decreased strength, impaired flexibility, and  pain.    REHAB POTENTIAL: Good  CLINICAL DECISION MAKING: Stable/uncomplicated  EVALUATION COMPLEXITY: Low  GOALS: Goals reviewed with patient? Yes  SHORT TERM GOALS: Target date: 06/01/2023  Patient will report at least a 50% improvement in chief complaint.  Baseline: Initial Goal status: INITIAL  2.  Patient will improve by at least 2 points on the Patient Specific Functional Scale for the item "lifting the arm overhead". Baseline: 4 Goal status: INITIAL  3.  Patient will improve R shoulder internal rotation at 90 degrees abduction to at least 45 degrees to improve ability to reach behind back. Baseline: 20 deg Goal status: INITIAL  LONG TERM GOALS: Target date: 07/13/2023  Patient will report at least a 75% improvement in chief complaint. Baseline: Initial Goal status: INITIAL  2.  Patient will improve by at least 2 points on the Patient Specific Functional Scale for the item "work". Baseline: Initial Goal status: INITIAL  3.  Patient will not have pain with strength testing of the R shoulder to demonstrate improved load tolerance of the involved musculature. Baseline: Painful all directions Goal status: INITIAL  PLAN: PT FREQUENCY: 1x/week  PT DURATION: 12 weeks  PLANNED INTERVENTIONS: 97110-Therapeutic exercises, 97530- Therapeutic activity, [id number]- Neuromuscular re-education, 97535- Self Care, 54098- Manual therapy, [phone]- Gait training, [phone]- Orthotic Fit/training, [phone]- Canalith repositioning, [id number]- Aquatic Therapy, 97014- Electrical stimulation (unattended), [phone]- Ionotophoresis 4mg /ml Dexamethasone, Patient/Family education, Balance training, Stair training, Taping, Dry Needling, Joint mobilization, Joint manipulation, Spinal manipulation, Spinal mobilization, Cryotherapy, and Moist heat   PLAN FOR NEXT SESSION: Cervical spine soft tissue and joint mobilizations, cervical spine P/AA/AROM, R shoulder joint mobilizations and soft tissue as indicated, R shoulder  P/AA/AROM, general  R shoulder/scapular strengthening as indicated, soft tissue and joint mobilization to the R upper/mid-thoracic spine including scapular musculature   Clelia Schaumann, PT 04/20/2023, 5:31 PM

## 2023-04-27 ENCOUNTER — Ambulatory Visit: Attending: Sports Medicine

## 2023-04-27 ENCOUNTER — Ambulatory Visit

## 2023-04-27 DIAGNOSIS — M25511 Pain in right shoulder: Secondary | ICD-10-CM | POA: Diagnosis present

## 2023-04-27 DIAGNOSIS — M25611 Stiffness of right shoulder, not elsewhere classified: Secondary | ICD-10-CM | POA: Diagnosis present

## 2023-04-27 DIAGNOSIS — M7541 Impingement syndrome of right shoulder: Secondary | ICD-10-CM | POA: Diagnosis present

## 2023-04-27 DIAGNOSIS — M542 Cervicalgia: Secondary | ICD-10-CM | POA: Insufficient documentation

## 2023-04-27 NOTE — Therapy (Signed)
 OUTPATIENT PHYSICAL THERAPY UPPER EXTREMITY TREATMENT   Patient Name: Frances Taylor MRN: [id number] DOB:[birth year], 63 y.o., female Today's Date: 04/27/2023  END OF SESSION:   Past Medical History:  Diagnosis Date   Abnormal Pap smear of cervix    Cervical high risk HPV (human papillomavirus) test positive    GERD (gastroesophageal reflux disease)    Headache    Hypertension    took herself off medication, lifestyle   Past Surgical History:  Procedure Laterality Date   IRRIGATION AND DEBRIDEMENT SEBACEOUS CYST     scalp   Patient Active Problem List   Diagnosis Date Noted   Pilar cyst of scalp 07/08/2022   Paronychia of right index finger 02/22/2022   Prediabetes 10/06/2021   Vitamin D insufficiency 10/06/2021   Elevated TSH 10/06/2021   Left ovarian cyst 07/25/2019   Dysplasia of cervix, low grade (CIN 1) 06/28/2018   Radiculitis of right cervical region 08/30/2016   Impingement syndrome, shoulder, right 08/19/2016   Right lumbar radiculitis 08/02/2016   Anterolisthesis 08/02/2016   Gastroesophageal reflux disease 04/25/2016   BPPV (benign paroxysmal positional vertigo) 04/13/2016   Nasopharyngitis 08/19/2013   Trapezius muscle spasm 03/05/2013   Plantar fasciitis, bilateral 12/04/2012   Migraine 06/19/2012   Tension type headache 06/19/2012   Depression 12/13/2011   Helicobacter pylori gastritis 12/13/2011   Sebaceous cyst 06/16/2011   Essential hypertension 11/22/2010    PCP: Christen Butter, NP  REFERRING PROVIDER: Rodney Langton, MD  REFERRING DIAG:  M75.41 (ICD-10-CM) - Impingement syndrome, shoulder, right   THERAPY DIAG:  Impingement syndrome, shoulder, right  Right shoulder pain, unspecified chronicity  Stiffness of right shoulder, not elsewhere classified  Cervicalgia  Rationale for Evaluation and Treatment: Rehabilitation  ONSET DATE: Referral date 04/13/2023, onset approximately 3-4 months ago  SUBJECTIVE:                                                                                                                                                                                       SUBJECTIVE STATEMENT: 04/27/2023: The patient returned to the clinic noting that she had some relief after the initial visit but things have returned to baseline since. She has also noted some crepitus when turning her head to the R but not the L. Things continue to be busy at work and the patient noted she has been getting less sleep as she tends to get home after her shift at 1am and then is not able to fall asleep to closer to 4am, and then her neighbor's new car has been waking her at 6am, causing her difficulty falling back asleep. Due to a scheduling conflict, patient arrived 15 minutes late today.  Evaluation: The patient reported that the R shoulder started to both a few months ago, sometime around December/January. It has not gotten worse overall since that time but there are periods when it is worse than others. She uses the R arm constantly at work, mostly moving/folding light items, but also has noticed that when she lifts something heavy the pain will cause her to drop it. She had radiographs performed earlier today - she saw Dr. Benjamin Stain as the first provider for this issue and has not had any interventions to date. The patient has started using [employer] which helps. The R shoulder pain is felt in the bra strap region at the top of the shoulder, and the pain can radiate toward the R neck area. Patient denies any numbness/tingling in the R arm, but does note some soreness in the R carpal tunnel region.  PERTINENT HISTORY: No pertinent history per patient report  PAIN:  Are you having pain? Yes: NPRS scale: at rest 5/10 at R neck, 7/10 at worst at R shoulder Pain location: See above Pain description: See above Aggravating factors: See above Relieving factors: See above  PRECAUTIONS: None  RED FLAGS: None   WEIGHT BEARING  RESTRICTIONS: No  FALLS:  Has patient fallen in last 6 months? No  PATIENT GOALS: Reduce pain, avoid surgery, be able to participate in work tasks  OBJECTIVE:  Note: Objective measures were completed at Evaluation unless otherwise noted.  DIAGNOSTIC FINDINGS:  R shoulder radiographs performed but waiting on results  PATIENT SURVEYS :  Patient-specific activity scoring scheme (Point to one number):  "0" represents "unable to perform." "10" represents "able to perform at prior level.  Activity Eval     Work  4    Lifting arm overhead  4    Sleeping 4    Total score = sum of the activity scores/number of activities Minimum detectable change (90%CI) for average score = 2 points Minimum detectable change (90%CI) for single activity score = 3 points PSFS developed by: Natividad Brood, M., & Binkley, J. (1995). Assessing disability and change on individual  patients: a report of a patient specific measure. Physiotherapy [place], 47, [phone]. Reproduced with the permission of the authors  CERVICAL ROM:   Active ROM A/PROM (deg) eval  Flexion 50  Extension 30  Right lateral flexion 35  Left lateral flexion 25  Right rotation 65  Left rotation 70   (Blank rows = not tested)  UPPER EXTREMITY ROM:   Active ROM Right eval Left eval  Shoulder flexion 130 145  Shoulder extension 50 65  Shoulder abduction 140 150  Shoulder adduction    Shoulder Apley internal rotation T8 T8  Shoulder Apley external rotation T3 T3  Elbow flexion    Elbow extension    Wrist flexion    Wrist extension    Wrist ulnar deviation    Wrist radial deviation    Wrist pronation    Wrist supination    (Blank rows = not tested)  Passive shoulder internal rotation at 90 abduction: -Left: WNL -Right: 20 deg  UPPER EXTREMITY MMT:  MMT Right eval Left eval  Shoulder flexion 4 4  Shoulder extension 4 4+  Shoulder abduction 4 4  Shoulder adduction 4+ 5  Shoulder internal  rotation 4+ 4+  Shoulder external rotation 3+ 4-  Middle trapezius    Lower trapezius    Elbow flexion    Elbow extension    Wrist flexion    Wrist  extension    Wrist ulnar deviation    Wrist radial deviation    Wrist pronation    Wrist supination    Grip strength (lbs)    (Blank rows = not tested)  JOINT MOBILITY TESTING:  -R shoulder hypomobile AP glide -Cervical spine generally hypomobile with central PA glides and rotational PIIVMs  PALPATION:  -Pain to palpation to the R upper trapezius (familiar pain) -Pain to palpation of the R cervical paraspinals -Pain to palpation of the R periscapular region   [facility] Adult PT Treatment:                                                DATE: 04/27/2023 Manual Therapy: Supine: Central PA glides to the cervical spine Segmental rotational PIIVM mobilization to the cervical spine Pin and stretch soft tissue mobilization to the R upper trapezius and R levator scapulae PA glides R ribs 1-4  [facility] Adult PT Treatment:                                                DATE: 04/20/2023 Therapeutic Exercise: Instructed home program (see below) Manual Therapy: Supine: Central PA glides to the cervical spine Soft tissue mobilization (pin and stretch) to the R upper trapezius  PATIENT EDUCATION:  Education details: on current presentation, on HEP, on clinical outcomes score and POC Person educated: Patient Education method: Explanation, Demonstration, and Handouts Education comprehension: verbalized understanding   HOME EXERCISE PROGRAM: Access Code: [email or web address] URL: [email or web address] Date: 04/20/2023 Prepared by: Edmonia Caprio  Program Notes Also added self soft tissue mobilization for the R upper trapezius using ball  Exercises - Seated Upper Trapezius Stretch  - 1 x daily - 7 x weekly - 4 sets - 30 hold  ASSESSMENT:  CLINICAL IMPRESSION: Continued addressing cervical spine joint and soft tissue mobility today. PROM of  the cervical spine improved with treatment into both rotation and side bend. At end of session, patient no longer noted crepitus with R cervical rotation. Physical therapy remains indicated.  Evaluation: Patient is a 63 y.o. who was seen today for physical therapy evaluation and treatment for R shoulder pain. The R shoulder does have limited ROM compared to the L, with posterior shoulder tightness noted. Strength testing was painful for all directions at the R shoulder, which points potentially to a more proximal pain generator. The patient did have limitations in cervical spine ROM as well as pain with palpation of the R cervical paraspinals, the R upper trapezius, and the R periscapular musculature. Likely, this is a mixed presentation involving the cervical spine, R shoulder, and R scapulothoracic regions. Patient likely to benefit from physical therapy to address these impairments to reduce pain and restore function.    OBJECTIVE IMPAIRMENTS: decreased mobility, decreased ROM, decreased strength, impaired flexibility, and pain.    REHAB POTENTIAL: Good  CLINICAL DECISION MAKING: Stable/uncomplicated  EVALUATION COMPLEXITY: Low  GOALS: Goals reviewed with patient? Yes  SHORT TERM GOALS: Target date: 06/01/2023  Patient will report at least a 50% improvement in chief complaint.  Baseline: Initial Goal status: INITIAL  2.  Patient will improve by at least 2 points on the Patient Specific Functional Scale for the item "lifting the arm overhead". Baseline: 4  Goal status: INITIAL  3.  Patient will improve R shoulder internal rotation at 90 degrees abduction to at least 45 degrees to improve ability to reach behind back. Baseline: 20 deg Goal status: INITIAL  LONG TERM GOALS: Target date: 07/13/2023  Patient will report at least a 75% improvement in chief complaint. Baseline: Initial Goal status: INITIAL  2.  Patient will improve by at least 2 points on the Patient Specific Functional  Scale for the item "work". Baseline: Initial Goal status: INITIAL  3.  Patient will not have pain with strength testing of the R shoulder to demonstrate improved load tolerance of the involved musculature. Baseline: Painful all directions Goal status: INITIAL  PLAN: PT FREQUENCY: 1x/week  PT DURATION: 12 weeks  PLANNED INTERVENTIONS: 97110-Therapeutic exercises, 97530- Therapeutic activity, [id number]- Neuromuscular re-education, 97535- Self Care, 60454- Manual therapy, [phone]- Gait training, [phone]- Orthotic Fit/training, [phone]- Canalith repositioning, [phone]- Aquatic Therapy, 97014- Electrical stimulation (unattended), [phone]- Ionotophoresis 4mg /ml Dexamethasone, Patient/Family education, Balance training, Stair training, Taping, Dry Needling, Joint mobilization, Joint manipulation, Spinal manipulation, Spinal mobilization, Cryotherapy, and Moist heat   PLAN FOR NEXT SESSION: Cervical spine soft tissue and joint mobilizations, cervical spine P/AA/AROM, R shoulder joint mobilizations and soft tissue as indicated, R shoulder P/AA/AROM, general R shoulder/scapular strengthening as indicated, soft tissue and joint mobilization to the R upper/mid-thoracic spine including scapular musculature, update home program as indicated.   Clelia Schaumann, PT 04/27/2023, 3:54 PM

## 2023-05-04 ENCOUNTER — Ambulatory Visit

## 2023-05-04 DIAGNOSIS — M25511 Pain in right shoulder: Secondary | ICD-10-CM

## 2023-05-04 DIAGNOSIS — M25611 Stiffness of right shoulder, not elsewhere classified: Secondary | ICD-10-CM

## 2023-05-04 DIAGNOSIS — M7541 Impingement syndrome of right shoulder: Secondary | ICD-10-CM | POA: Diagnosis not present

## 2023-05-04 DIAGNOSIS — M542 Cervicalgia: Secondary | ICD-10-CM

## 2023-05-04 NOTE — Therapy (Signed)
 OUTPATIENT PHYSICAL THERAPY UPPER EXTREMITY TREATMENT   Patient Name: Frances Taylor MRN: [id number] DOB:March 09, 1960, 63 y.o., female Today's Date: 05/04/2023  END OF SESSION:   Past Medical History:  Diagnosis Date   Abnormal Pap smear of cervix    Cervical high risk HPV (human papillomavirus) test positive    GERD (gastroesophageal reflux disease)    Headache    Hypertension    took herself off medication, lifestyle   Past Surgical History:  Procedure Laterality Date   IRRIGATION AND DEBRIDEMENT SEBACEOUS CYST     scalp   Patient Active Problem List   Diagnosis Date Noted   Pilar cyst of scalp 07/08/2022   Paronychia of right index finger 02/22/2022   Prediabetes 10/06/2021   Vitamin D insufficiency 10/06/2021   Elevated TSH 10/06/2021   Left ovarian cyst 07/25/2019   Dysplasia of cervix, low grade (CIN 1) 06/28/2018   Radiculitis of right cervical region 08/30/2016   Impingement syndrome, shoulder, right 08/19/2016   Right lumbar radiculitis 08/02/2016   Anterolisthesis 08/02/2016   Gastroesophageal reflux disease 04/25/2016   BPPV (benign paroxysmal positional vertigo) 04/13/2016   Nasopharyngitis 08/19/2013   Trapezius muscle spasm 03/05/2013   Plantar fasciitis, bilateral 12/04/2012   Migraine 06/19/2012   Tension type headache 06/19/2012   Depression 12/13/2011   Helicobacter pylori gastritis 12/13/2011   Sebaceous cyst 06/16/2011   Essential hypertension 11/22/2010    PCP: Christen Butter, NP  REFERRING PROVIDER: Rodney Langton, MD  REFERRING DIAG:  M75.41 (ICD-10-CM) - Impingement syndrome, shoulder, right   THERAPY DIAG:  Impingement syndrome, shoulder, right  Right shoulder pain, unspecified chronicity  Stiffness of right shoulder, not elsewhere classified  Cervicalgia  Rationale for Evaluation and Treatment: Rehabilitation  ONSET DATE: Referral date 04/13/2023, onset approximately 3-4 months ago  SUBJECTIVE:                                                                                                                                                                                       SUBJECTIVE STATEMENT: 05/01/2023: The patient returned to the clinic stating that her pain continues to be only in the R upper trapezius region. It bothers her enough where she is putting on the Tiger Balm patches to help with the pain, but she has not had to take any Advil or Tylenol any more. She has been doing her neck stretches as part of their warmup routine at work while the others are stretching their wrists. The patient has continued to notice crepitus when turning her head to the R - still no crepitus turning L.  Evaluation: The patient reported that the R shoulder started to both a few months ago,  sometime around December/January. It has not gotten worse overall since that time but there are periods when it is worse than others. She uses the R arm constantly at work, mostly moving/folding light items, but also has noticed that when she lifts something heavy the pain will cause her to drop it. She had radiographs performed earlier today - she saw Dr. Benjamin Stain as the first provider for this issue and has not had any interventions to date. The patient has started using [employer] which helps. The R shoulder pain is felt in the bra strap region at the top of the shoulder, and the pain can radiate toward the R neck area. Patient denies any numbness/tingling in the R arm, but does note some soreness in the R carpal tunnel region.  PERTINENT HISTORY: No pertinent history per patient report  PAIN:  Are you having pain? Yes: NPRS scale: at rest 5/10 at R neck, 7/10 at worst at R shoulder Pain location: See above Pain description: See above Aggravating factors: See above Relieving factors: See above  PRECAUTIONS: None  RED FLAGS: None   WEIGHT BEARING RESTRICTIONS: No  FALLS:  Has patient fallen in last 6 months? No  PATIENT GOALS:  Reduce pain, avoid surgery, be able to participate in work tasks  OBJECTIVE:  Note: Objective measures were completed at Evaluation unless otherwise noted.  DIAGNOSTIC FINDINGS:  R shoulder radiographs performed but waiting on results  PATIENT SURVEYS :  Patient-specific activity scoring scheme (Point to one number):  "0" represents "unable to perform." "10" represents "able to perform at prior level.  Activity Eval     Work  4    Lifting arm overhead  4    Sleeping 4    Total score = sum of the activity scores/number of activities Minimum detectable change (90%CI) for average score = 2 points Minimum detectable change (90%CI) for single activity score = 3 points PSFS developed by: Natividad Brood, M., & Binkley, J. (1995). Assessing disability and change on individual  patients: a report of a patient specific measure. Physiotherapy [place], 47, [phone]. Reproduced with the permission of the authors  CERVICAL ROM:   Active ROM A/PROM (deg) eval  Flexion 50  Extension 30  Right lateral flexion 35  Left lateral flexion 25  Right rotation 65  Left rotation 70   (Blank rows = not tested)  UPPER EXTREMITY ROM:   Active ROM Right eval Left eval  Shoulder flexion 130 145  Shoulder extension 50 65  Shoulder abduction 140 150  Shoulder adduction    Shoulder Apley internal rotation T8 T8  Shoulder Apley external rotation T3 T3  Elbow flexion    Elbow extension    Wrist flexion    Wrist extension    Wrist ulnar deviation    Wrist radial deviation    Wrist pronation    Wrist supination    (Blank rows = not tested)  Passive shoulder internal rotation at 90 abduction: -Left: WNL -Right: 20 deg  UPPER EXTREMITY MMT:  MMT Right eval Left eval  Shoulder flexion 4 4  Shoulder extension 4 4+  Shoulder abduction 4 4  Shoulder adduction 4+ 5  Shoulder internal rotation 4+ 4+  Shoulder external rotation 3+ 4-  Middle trapezius    Lower trapezius     Elbow flexion    Elbow extension    Wrist flexion    Wrist extension    Wrist ulnar deviation    Wrist radial deviation  Wrist pronation    Wrist supination    Grip strength (lbs)    (Blank rows = not tested)  JOINT MOBILITY TESTING:  -R shoulder hypomobile AP glide -Cervical spine generally hypomobile with central PA glides and rotational PIIVMs  PALPATION:  -Pain to palpation to the R upper trapezius (familiar pain) -Pain to palpation of the R cervical paraspinals -Pain to palpation of the R periscapular region   [facility] Adult PT Treatment:                                                DATE: 05/04/2023 Therapeutic Exercise: Instructed seated flexion stretch with expansive inhale for home program Manual Therapy: Supine: Central PA glides to the cervical spine Segmental rotational PIIVM mobilization to the cervical spine Segmental side glide PIIVM mobilization to the cervical spine L to R Pin and stretch soft tissue mobilization to the R upper trapezius and R levator scapulae PA glides R ribs 1-4  [facility] Adult PT Treatment:                                                DATE: 04/27/2023 Manual Therapy: Supine: Central PA glides to the cervical spine Segmental rotational PIIVM mobilization to the cervical spine Pin and stretch soft tissue mobilization to the R upper trapezius and R levator scapulae PA glides R ribs 1-4  [facility] Adult PT Treatment:                                                DATE: 04/20/2023 Therapeutic Exercise: Instructed home program (see below) Manual Therapy: Supine: Central PA glides to the cervical spine Soft tissue mobilization (pin and stretch) to the R upper trapezius  PATIENT EDUCATION:  Education details: on current presentation, on HEP, on clinical outcomes score and POC Person educated: Patient Education method: Explanation, Demonstration, and Handouts Education comprehension: verbalized understanding   HOME EXERCISE PROGRAM: Access  Code: [email or web address] URL: [email or web address] Date: 05/04/2023 Prepared by: Edmonia Caprio  Program Notes Also added self soft tissue mobilization for the R upper trapezius using ball  Exercises - Seated Upper Trapezius Stretch  - 1 x daily - 7 x weekly - 4 sets - 30 hold - Seated Flexion Stretch with Expansive Inhale  - 1 x daily - 7 x weekly - 3 sets - 10 reps  ASSESSMENT:  CLINICAL IMPRESSION: Continued addressing cervical spine joint and soft tissue mobility today. R side bend was most restricted today which improved with treatment. Overall, patient responding well to plan of care with focus mostly at cervical spine and R upper trapezius region. Further treatment to the upper thoracic spine would also be beneficial give patient's forward head posture with kyphotic prominence at the upper thoracic region. Similarly, further development of a postural exercise program would be of benefit. Physical therapy remains indicated.  Evaluation: Patient is a 63 y.o. who was seen today for physical therapy evaluation and treatment for R shoulder pain. The R shoulder does have limited ROM compared to the L, with posterior shoulder tightness noted. Strength testing was painful for all directions at the  R shoulder, which points potentially to a more proximal pain generator. The patient did have limitations in cervical spine ROM as well as pain with palpation of the R cervical paraspinals, the R upper trapezius, and the R periscapular musculature. Likely, this is a mixed presentation involving the cervical spine, R shoulder, and R scapulothoracic regions. Patient likely to benefit from physical therapy to address these impairments to reduce pain and restore function.    OBJECTIVE IMPAIRMENTS: decreased mobility, decreased ROM, decreased strength, impaired flexibility, and pain.    REHAB POTENTIAL: Good  CLINICAL DECISION MAKING: Stable/uncomplicated  EVALUATION COMPLEXITY: Low  GOALS: Goals  reviewed with patient? Yes  SHORT TERM GOALS: Target date: 06/01/2023  Patient will report at least a 50% improvement in chief complaint.  Baseline: Initial Goal status: INITIAL  2.  Patient will improve by at least 2 points on the Patient Specific Functional Scale for the item "lifting the arm overhead". Baseline: 4 Goal status: INITIAL  3.  Patient will improve R shoulder internal rotation at 90 degrees abduction to at least 45 degrees to improve ability to reach behind back. Baseline: 20 deg Goal status: INITIAL  LONG TERM GOALS: Target date: 07/13/2023  Patient will report at least a 75% improvement in chief complaint. Baseline: Initial Goal status: INITIAL  2.  Patient will improve by at least 2 points on the Patient Specific Functional Scale for the item "work". Baseline: Initial Goal status: INITIAL  3.  Patient will not have pain with strength testing of the R shoulder to demonstrate improved load tolerance of the involved musculature. Baseline: Painful all directions Goal status: INITIAL  PLAN: PT FREQUENCY: 1x/week  PT DURATION: 12 weeks  PLANNED INTERVENTIONS: 97110-Therapeutic exercises, 97530- Therapeutic activity, [id number]- Neuromuscular re-education, 97535- Self Care, 21308- Manual therapy, [phone]- Gait training, [phone]- Orthotic Fit/training, [phone]- Canalith repositioning, [phone]- Aquatic Therapy, 97014- Electrical stimulation (unattended), [phone]- Ionotophoresis 4mg /ml Dexamethasone, Patient/Family education, Balance training, Stair training, Taping, Dry Needling, Joint mobilization, Joint manipulation, Spinal manipulation, Spinal mobilization, Cryotherapy, and Moist heat   PLAN FOR NEXT SESSION: Postural exercises and home program development, cervical spine soft tissue and joint mobilizations, cervical spine P/AA/AROM, upper thoracic soft tissue/joint mobilization and retraction/extension-based exercises;  R shoulder joint mobilizations and soft tissue as indicated, R  shoulder P/AA/AROM, general R shoulder/scapular strengthening as indicated, update home program as indicated.  Clelia Schaumann, PT 05/04/2023, 3:08 PM

## 2023-05-11 ENCOUNTER — Encounter: Admitting: Rehabilitative and Restorative Service Providers"

## 2023-05-18 ENCOUNTER — Ambulatory Visit: Admitting: Rehabilitative and Restorative Service Providers"

## 2023-05-25 ENCOUNTER — Ambulatory Visit: Attending: Sports Medicine

## 2023-05-25 DIAGNOSIS — M542 Cervicalgia: Secondary | ICD-10-CM | POA: Diagnosis present

## 2023-05-25 DIAGNOSIS — M7541 Impingement syndrome of right shoulder: Secondary | ICD-10-CM | POA: Diagnosis present

## 2023-05-25 DIAGNOSIS — M25511 Pain in right shoulder: Secondary | ICD-10-CM | POA: Insufficient documentation

## 2023-05-25 DIAGNOSIS — M25611 Stiffness of right shoulder, not elsewhere classified: Secondary | ICD-10-CM | POA: Insufficient documentation

## 2023-05-25 NOTE — Therapy (Addendum)
 OUTPATIENT PHYSICAL THERAPY UPPER EXTREMITY TREATMENT PHYSICAL THERAPY DISCHARGE SUMMARY  Visits from Start of Care: 2  Current functional level related to goals / functional outcomes: See progress note for discharge status    Remaining deficits: Unknown    Education / Equipment: HEP    Patient agrees to discharge. Patient goals were partially met. Patient is being discharged due to not returning since the last visit.  Celyn P. Ina PT, MPH 10/04/23 9:10 AM For Frances Taylor, PT     Patient Name: Frances Taylor MRN: [id number] DOB:Feb 11, 1960, 63 y.o., female Today's Date: 05/25/2023  END OF SESSION:   Past Medical History:  Diagnosis Date   Abnormal Pap smear of cervix    Cervical high risk HPV (human papillomavirus) test positive    GERD (gastroesophageal reflux disease)    Headache    Hypertension    took herself off medication, lifestyle   Past Surgical History:  Procedure Laterality Date   IRRIGATION AND DEBRIDEMENT SEBACEOUS CYST     scalp   Patient Active Problem List   Diagnosis Date Noted   Pilar cyst of scalp 07/08/2022   Paronychia of right index finger 02/22/2022   Prediabetes 10/06/2021   Vitamin D  insufficiency 10/06/2021   Elevated TSH 10/06/2021   Left ovarian cyst 07/25/2019   Dysplasia of cervix, low grade (CIN 1) 06/28/2018   Radiculitis of right cervical region 08/30/2016   Impingement syndrome, shoulder, right 08/19/2016   Right lumbar radiculitis 08/02/2016   Anterolisthesis 08/02/2016   Gastroesophageal reflux disease 04/25/2016   BPPV (benign paroxysmal positional vertigo) 04/13/2016   Nasopharyngitis 08/19/2013   Trapezius muscle spasm 03/05/2013   Plantar fasciitis, bilateral 12/04/2012   Migraine 06/19/2012   Tension type headache 06/19/2012   Depression 12/13/2011   Helicobacter pylori gastritis 12/13/2011   Sebaceous cyst 06/16/2011   Essential hypertension 11/22/2010    PCP: Zada Palin, NP  REFERRING PROVIDER:  Debby Petties, MD  REFERRING DIAG:  M75.41 (ICD-10-CM) - Impingement syndrome, shoulder, right   THERAPY DIAG:  Impingement syndrome, shoulder, right  Right shoulder pain, unspecified chronicity  Stiffness of right shoulder, not elsewhere classified  Cervicalgia  Rationale for Evaluation and Treatment: Rehabilitation  ONSET DATE: Referral date 04/13/2023, onset approximately 3-4 months ago  SUBJECTIVE:                                                                                                                                                                                      SUBJECTIVE STATEMENT: 05/25/2023: Patient returned to the clinic stating that her R shoulder pain continues to be better overall to where she does not need to take pain medication and use only the  Tiger Balm patches, however, she does feel like there has been some plateau in improvement - things have not been regressing or progressing. She also continues to note crepitus with R cervical rotation.  Evaluation: The patient reported that the R shoulder started to both a few months ago, sometime around December/January. It has not gotten worse overall since that time but there are periods when it is worse than others. She uses the R arm constantly at work, mostly moving/folding light items, but also has noticed that when she lifts something heavy the pain will cause her to drop it. She had radiographs performed earlier today - she saw Dr. Curtis as the first provider for this issue and has not had any interventions to date. The patient has started using Tiger Balm which helps. The R shoulder pain is felt in the bra strap region at the top of the shoulder, and the pain can radiate toward the R neck area. Patient denies any numbness/tingling in the R arm, but does note some soreness in the R carpal tunnel region.  PERTINENT HISTORY: No pertinent history per patient report  PAIN:  Are you having pain? Yes:  NPRS scale: at rest 5/10 at R neck, 7/10 at worst at R shoulder Pain location: See above Pain description: See above Aggravating factors: See above Relieving factors: See above  PRECAUTIONS: None  RED FLAGS: None   WEIGHT BEARING RESTRICTIONS: No  FALLS:  Has patient fallen in last 6 months? No  PATIENT GOALS: Reduce pain, avoid surgery, be able to participate in work tasks  OBJECTIVE:  Note: Objective measures were completed at Evaluation unless otherwise noted.  DIAGNOSTIC FINDINGS:  R shoulder radiographs performed but waiting on results  PATIENT SURVEYS :  Patient-specific activity scoring scheme (Point to one number):  0 represents "unable to perform." 10 represents "able to perform at prior level.  Activity Eval     Work  4    Lifting arm overhead  4    Sleeping 4    Total score = sum of the activity scores/number of activities Minimum detectable change (90%CI) for average score = 2 points Minimum detectable change (90%CI) for single activity score = 3 points PSFS developed by: Rosalee MYRTIS Marvis KYM Charlet, M., & Binkley, J. (1995). Assessing disability and change on individual  patients: a report of a patient specific measure. Physiotherapy [place], 47, [phone]. Reproduced with the permission of the authors  CERVICAL ROM:   Active ROM A/PROM (deg) eval  Flexion 50  Extension 30  Right lateral flexion 35  Left lateral flexion 25  Right rotation 65  Left rotation 70   (Blank rows = not tested)  UPPER EXTREMITY ROM:   Active ROM Right eval Left eval  Shoulder flexion 130 145  Shoulder extension 50 65  Shoulder abduction 140 150  Shoulder adduction    Shoulder Apley internal rotation T8 T8  Shoulder Apley external rotation T3 T3  Elbow flexion    Elbow extension    Wrist flexion    Wrist extension    Wrist ulnar deviation    Wrist radial deviation    Wrist pronation    Wrist supination    (Blank rows = not tested)  Passive shoulder  internal rotation at 90 abduction: -Left: WNL -Right: 20 deg  UPPER EXTREMITY MMT:  MMT Right eval Left eval  Shoulder flexion 4 4  Shoulder extension 4 4+  Shoulder abduction 4 4  Shoulder adduction 4+ 5  Shoulder internal rotation 4+  4+  Shoulder external rotation 3+ 4-  Middle trapezius    Lower trapezius    Elbow flexion    Elbow extension    Wrist flexion    Wrist extension    Wrist ulnar deviation    Wrist radial deviation    Wrist pronation    Wrist supination    Grip strength (lbs)    (Blank rows = not tested)  JOINT MOBILITY TESTING:  -R shoulder hypomobile AP glide -Cervical spine generally hypomobile with central PA glides and rotational PIIVMs  PALPATION:  -Pain to palpation to the R upper trapezius (familiar pain) -Pain to palpation of the R cervical paraspinals -Pain to palpation of the R periscapular region   [facility] Adult PT Treatment:                                                DATE: 05/25/2023 Therapeutic Exercise: Supine: Manual stretching R upper trapezius and R levator scapulae Manual Therapy: Supine: Central PA glides to the cervical spine Segmental rotational PIIVM mobilization to the cervical spine Segmental side glide PIIVM mobilization to the cervical spine L to R L sidelying: PA and PA/caudal glides to R ribs 1-8 and central T1-T8  [facility] Adult PT Treatment:                                                DATE: 05/04/2023 Therapeutic Exercise: Instructed seated flexion stretch with expansive inhale for home program Manual Therapy: Supine: Central PA glides to the cervical spine Segmental rotational PIIVM mobilization to the cervical spine Segmental side glide PIIVM mobilization to the cervical spine L to R Pin and stretch soft tissue mobilization to the R upper trapezius and R levator scapulae PA glides R ribs 1-4  [facility] Adult PT Treatment:                                                DATE: 04/27/2023 Manual  Therapy: Supine: Central PA glides to the cervical spine Segmental rotational PIIVM mobilization to the cervical spine Pin and stretch soft tissue mobilization to the R upper trapezius and R levator scapulae PA glides R ribs 1-4  [facility] Adult PT Treatment:                                                DATE: 04/20/2023 Therapeutic Exercise: Instructed home program (see below) Manual Therapy: Supine: Central PA glides to the cervical spine Soft tissue mobilization (pin and stretch) to the R upper trapezius  PATIENT EDUCATION:  Education details: on current presentation, on HEP, on clinical outcomes score and POC Person educated: Patient Education method: Explanation, Demonstration, and Handouts Education comprehension: verbalized understanding   HOME EXERCISE PROGRAM: Access Code: [email or web address] URL: [email or web address] Date: 05/04/2023 Prepared by: Frances Taylor  Program Notes Also added self soft tissue mobilization for the R upper trapezius using ball  Exercises - Seated Upper Trapezius Stretch  - 1 x daily - 7 x weekly -  4 sets - 30 hold - Seated Flexion Stretch with Expansive Inhale  - 1 x daily - 7 x weekly - 3 sets - 10 reps  ASSESSMENT:  CLINICAL IMPRESSION: Patient continues to note improvement compared to initial session but has noted some plateau in progress, though, she has not been into the clinic since 05/04/2023. Continued with cervical and upper thoracic mobility today - more targeted today at the upper thoracic in sidelying position. Crepitus remained present at end of session, but brief trial of mobilization with movement at the cervical and thoracic spine eliminated the crepitus during treatment - plan to perform more mobilizations with movement next session. Physical therapy remains indicated.  Evaluation: Patient is a 63 y.o. who was seen today for physical therapy evaluation and treatment for R shoulder pain. The R shoulder does have limited ROM  compared to the L, with posterior shoulder tightness noted. Strength testing was painful for all directions at the R shoulder, which points potentially to a more proximal pain generator. The patient did have limitations in cervical spine ROM as well as pain with palpation of the R cervical paraspinals, the R upper trapezius, and the R periscapular musculature. Likely, this is a mixed presentation involving the cervical spine, R shoulder, and R scapulothoracic regions. Patient likely to benefit from physical therapy to address these impairments to reduce pain and restore function.    OBJECTIVE IMPAIRMENTS: decreased mobility, decreased ROM, decreased strength, impaired flexibility, and pain.    REHAB POTENTIAL: Good  CLINICAL DECISION MAKING: Stable/uncomplicated  EVALUATION COMPLEXITY: Low  GOALS: Goals reviewed with patient? Yes  SHORT TERM GOALS: Target date: 06/01/2023  Patient will report at least a 50% improvement in chief complaint.  Baseline: Initial Goal status: INITIAL  2.  Patient will improve by at least 2 points on the Patient Specific Functional Scale for the item lifting the arm overhead. Baseline: 4 Goal status: INITIAL  3.  Patient will improve R shoulder internal rotation at 90 degrees abduction to at least 45 degrees to improve ability to reach behind back. Baseline: 20 deg Goal status: INITIAL  LONG TERM GOALS: Target date: 07/13/2023  Patient will report at least a 75% improvement in chief complaint. Baseline: Initial Goal status: INITIAL  2.  Patient will improve by at least 2 points on the Patient Specific Functional Scale for the item work. Baseline: Initial Goal status: INITIAL  3.  Patient will not have pain with strength testing of the R shoulder to demonstrate improved load tolerance of the involved musculature. Baseline: Painful all directions Goal status: INITIAL  PLAN: PT FREQUENCY: 1x/week  PT DURATION: 12 weeks  PLANNED INTERVENTIONS:  97110-Therapeutic exercises, 97530- Therapeutic activity, [id number]- Neuromuscular re-education, 97535- Self Care, [id number]- Manual therapy, [phone]- Gait training, [phone]- Orthotic Fit/training, [phone]- Canalith repositioning, [phone]- Aquatic Therapy, [id number]- Electrical stimulation (unattended), [phone]- Ionotophoresis 4mg /ml Dexamethasone, Patient/Family education, Balance training, Stair training, Taping, Dry Needling, Joint mobilization, Joint manipulation, Spinal manipulation, Spinal mobilization, Cryotherapy, and Moist heat   PLAN FOR NEXT SESSION: Postural exercises and home program development, cervical spine soft tissue and joint mobilizations, cervical spine P/AA/AROM, upper thoracic soft tissue/joint mobilization and retraction/extension-based exercises;  R shoulder joint mobilizations and soft tissue as indicated, R shoulder P/AA/AROM, general R shoulder/scapular strengthening as indicated, update home program as indicated.  Frances GORMAN Taylor, PT 05/25/2023, 5:16 PM

## 2023-06-01 ENCOUNTER — Encounter

## 2023-07-04 ENCOUNTER — Ambulatory Visit (INDEPENDENT_AMBULATORY_CARE_PROVIDER_SITE_OTHER): Admitting: Medical-Surgical

## 2023-07-04 ENCOUNTER — Encounter: Payer: Self-pay | Admitting: Medical-Surgical

## 2023-07-04 VITALS — BP 121/77 | HR 58 | Resp 20 | Ht 65.5 in | Wt 157.3 lb

## 2023-07-04 DIAGNOSIS — R1030 Lower abdominal pain, unspecified: Secondary | ICD-10-CM | POA: Diagnosis not present

## 2023-07-04 DIAGNOSIS — R110 Nausea: Secondary | ICD-10-CM | POA: Diagnosis not present

## 2023-07-04 MED ORDER — ONDANSETRON 4 MG PO TBDP: 4.0000 mg | ORAL_TABLET | Freq: Three times a day (TID) | ORAL | 2 refills | Status: AC | PRN

## 2023-07-04 NOTE — Progress Notes (Signed)
        Established patient visit  History, exam, impression, and plan:  1. Nausea (Primary) 2. Lower abdominal pain Pleasant 63 year old female presenting today with reports of approximately 4 days of nausea and lower abdominal cramping.  Also reports decreased appetite, sensitivity to smells, headache, chills, and 3 days of diarrhea.  No documented fevers, heartburn, vomiting, or sore throat.  No new restaurants, medications, or foods.  She did go to [employer] with a friend when she was in [place] however this was on Tuesday and her symptoms did not start until Friday.  She has been using Bayer aspirin and Tylenol  which have helped with the headache but not with the abdominal issues.  On exam, she is alert and oriented however is ill-appearing.  No acute distress.  Respirations even unlabored with clear lungs.  HRRR, S1/S2 normal.  Skin warm and dry.  Abdomen soft, nondistended.  Bowel sounds positive x 4 quadrants.  Mild tenderness to palpation in bilateral lower quadrants.  Moderate tenderness to palpation in the bilateral upper quadrants in the epigastric region.  Unclear etiology.  Plan to do an H. pylori breath test as her history includes a positive H pylori gastritis.  We will also check a GI profile.  Sending in Zofran every 8 hours as needed for nausea.  Discussed starting with the brat diet and then gradually increasing foods as tolerated.  Suspect this is likely a viral gastroenteritis however if symptoms persist and do not improve by Friday, labs placed so that she can come by and have the samples drawn.  She does still have her gallbladder so consider possible cholecystitis.  May benefit from a right upper quadrant ultrasound if symptoms do not resolve. - CBC with Differential/Platelet - CMP14+EGFR - TSH - Amylase - Lipase - DG Abd 1 View; Future - ondansetron (ZOFRAN-ODT) 4 MG disintegrating tablet; Take 1 tablet (4 mg total) by mouth every 8 (eight) hours as needed for nausea or  vomiting.  Dispense: 20 tablet; Refill: 2 - GI Profile, Stool, PCR - H. pylori breath test  Procedures performed this visit: None.  Return if symptoms worsen or fail to improve.  __________________________________ Maryl Snook, DNP, APRN, FNP-BC Primary Care and Sports Medicine [facility] [place]

## 2023-07-06 LAB — H. PYLORI BREATH TEST: H pylori Breath Test: NEGATIVE

## 2023-07-07 ENCOUNTER — Ambulatory Visit: Payer: Self-pay | Admitting: Medical-Surgical

## 2023-08-28 ENCOUNTER — Other Ambulatory Visit: Payer: Self-pay | Admitting: Obstetrics and Gynecology

## 2023-08-28 DIAGNOSIS — Z1231 Encounter for screening mammogram for malignant neoplasm of breast: Secondary | ICD-10-CM

## 2023-09-21 ENCOUNTER — Ambulatory Visit

## 2023-09-26 ENCOUNTER — Encounter: Payer: Self-pay | Admitting: Sports Medicine

## 2023-11-16 ENCOUNTER — Ambulatory Visit (INDEPENDENT_AMBULATORY_CARE_PROVIDER_SITE_OTHER)

## 2023-11-16 DIAGNOSIS — Z1231 Encounter for screening mammogram for malignant neoplasm of breast: Secondary | ICD-10-CM | POA: Diagnosis not present

## 2024-02-20 NOTE — Progress Notes (Unsigned)

## 2024-02-21 ENCOUNTER — Ambulatory Visit: Admitting: Medical-Surgical

## 2024-02-27 ENCOUNTER — Ambulatory Visit: Admitting: Medical-Surgical
